# Patient Record
Sex: Male | Born: 1950 | Race: White | Hispanic: No | Marital: Married | Smoking: Never smoker
Health system: Southern US, Community
[De-identification: ages and names within clinical notes are randomized; demographics above are authoritative.]

## PROBLEM LIST (undated history)

## (undated) DIAGNOSIS — I10 Essential (primary) hypertension: Secondary | ICD-10-CM

## (undated) DIAGNOSIS — E039 Hypothyroidism, unspecified: Secondary | ICD-10-CM

## (undated) DIAGNOSIS — Z87442 Personal history of urinary calculi: Secondary | ICD-10-CM

## (undated) DIAGNOSIS — C61 Malignant neoplasm of prostate: Secondary | ICD-10-CM

## (undated) DIAGNOSIS — I341 Nonrheumatic mitral (valve) prolapse: Secondary | ICD-10-CM

## (undated) DIAGNOSIS — R011 Cardiac murmur, unspecified: Secondary | ICD-10-CM

## (undated) HISTORY — PX: LITHOTRIPSY: SUR834

## (undated) HISTORY — PX: PROSTATE BIOPSY: SHX241

## (undated) HISTORY — PX: OTHER SURGICAL HISTORY: SHX169

## (undated) HISTORY — PX: APPENDECTOMY: SHX54

---

## 2002-10-26 ENCOUNTER — Observation Stay (HOSPITAL_COMMUNITY): Admission: EM | Admit: 2002-10-26 | Discharge: 2002-10-27 | Payer: Self-pay | Admitting: *Deleted

## 2002-10-26 ENCOUNTER — Encounter: Payer: Self-pay | Admitting: Emergency Medicine

## 2002-10-26 ENCOUNTER — Emergency Department (HOSPITAL_COMMUNITY): Admission: EM | Admit: 2002-10-26 | Discharge: 2002-10-26 | Payer: Self-pay | Admitting: Emergency Medicine

## 2002-10-26 ENCOUNTER — Encounter: Payer: Self-pay | Admitting: Urology

## 2002-11-11 ENCOUNTER — Ambulatory Visit (HOSPITAL_BASED_OUTPATIENT_CLINIC_OR_DEPARTMENT_OTHER): Admission: RE | Admit: 2002-11-11 | Discharge: 2002-11-11 | Payer: Self-pay | Admitting: Urology

## 2002-11-11 ENCOUNTER — Encounter: Payer: Self-pay | Admitting: Urology

## 2005-10-12 ENCOUNTER — Emergency Department (HOSPITAL_COMMUNITY): Admission: EM | Admit: 2005-10-12 | Discharge: 2005-10-12 | Payer: Self-pay | Admitting: Family Medicine

## 2018-04-08 NOTE — Progress Notes (Signed)
GU Location of Tumor / Histology: Adenocarcinoma of the Prostate  If Prostate Cancer, Gleason Score is (3 + 4) and PSA is (4.34) and volume 62.2 grams.  William Conner presented about a month ago with signs/symptoms of: not fully emptying his bladder, starting and stopping several times while urinating, and having the push/strain to start urination.  Biopsies (if applicable) revealed: 67/03/4579   Past/Anticipated interventions by urology, if any: prostate biopsy, referral to Dr. Tammi Klippel to discuss radiation options, referral to Dr. Tresa Moore (appt on 05/01/2017) to discuss surgical options.   Past/Anticipated interventions by medical oncology, if any: no  Weight changes, if any: no  Bowel/Bladder complaints, if any: SHIM 18. IPSS 23. Reports frequent urination and getting up several times at night to urinate. Reports urgency. Denies dysuria, hematuria, urinary leakage or incontinence.    Nausea/Vomiting, if any: no  Pain issues, if any:  no  SAFETY ISSUES:  Prior radiation? no  Pacemaker/ICD? no  Possible current pregnancy? N/A  Is the patient on methotrexate? no  Current Complaints / other details:  67 years old. 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 Never smoked and does not have a family history of prostate cancer.

## 2018-04-13 ENCOUNTER — Encounter: Payer: Self-pay | Admitting: Medical Oncology

## 2018-04-13 ENCOUNTER — Other Ambulatory Visit: Payer: Self-pay

## 2018-04-13 ENCOUNTER — Ambulatory Visit
Admission: RE | Admit: 2018-04-13 | Discharge: 2018-04-13 | Disposition: A | Discharge status: Home / Self Care | Payer: Medicare Other | Source: Ambulatory Visit | Attending: Radiation Oncology | Admitting: Radiation Oncology

## 2018-04-13 ENCOUNTER — Encounter: Payer: Self-pay | Admitting: Radiation Oncology

## 2018-04-13 VITALS — BP 138/95 | HR 50 | Temp 98.1°F | Resp 20 | Wt 172.0 lb

## 2018-04-13 DIAGNOSIS — C61 Malignant neoplasm of prostate: Secondary | ICD-10-CM | POA: Insufficient documentation

## 2018-04-13 DIAGNOSIS — Z79899 Other long term (current) drug therapy: Secondary | ICD-10-CM | POA: Diagnosis not present

## 2018-04-13 HISTORY — DX: Malignant neoplasm of prostate: C61

## 2018-04-13 NOTE — Progress Notes (Signed)
2 Radiation Oncology         [phone] ________________________________  Initial Outpatient Consultation  Name: William Conner MRN: [id number]  Date: 04/13/2018  DOB: 09/30/1950  WE:[id number], Pcp Not In  Alyson Ingles Candee Furbish, MD   REFERRING PHYSICIAN: Cleon Gustin, MD  DIAGNOSIS: 68 y.o. gentleman with favorable,  intermediate risk, Stage T1c adenocarcinoma of the prostate with Gleason Score of 3+4, and PSA of 4.34    ICD-10-CM   1. Malignant neoplasm of prostate (William Conner) C61     HISTORY OF PRESENT ILLNESS: William Conner is a 67 y.o. male with a diagnosis of prostate cancer. He was noted to have an elevated PSA of 4.34 by his primary care physician, Dr. Mellody Drown.  Accordingly, he was referred for evaluation in urology by Dr. Alyson Ingles on 01/26/18,  digital rectal examination was performed at that time revealing symmetrical firmness with no nodules.  The patient proceeded to transrectal ultrasound with 12 biopsies of the prostate on 03/09/18.  The prostate volume measured 62.2 cc.  Out of 12 core biopsies, 4 were positive.  The maximum Gleason score was 3+4, and this was seen in left mid lateral and left apex lateral. Gleason 3+3 was seen in left apex and right mid lateral. He is scheduled to meet with Dr. Tresa Moore on 05/01/18.  The patient reviewed the biopsy results with his urologist and he has kindly been referred today for discussion of potential radiation treatment options.   PREVIOUS RADIATION THERAPY: No  PAST MEDICAL HISTORY:  Past Medical History:  Diagnosis Date  . Prostate cancer ([place])       PAST SURGICAL HISTORY: Past Surgical History:  Procedure Laterality Date  . APPENDECTOMY     at the age of 61  . LITHOTRIPSY    . OTHER SURGICAL HISTORY     exploratory heart surgery at the age of 63 (62)  . PROSTATE BIOPSY      FAMILY HISTORY:  Family History  Problem Relation Age of Onset  . Cancer Brother        brain tumor    SOCIAL HISTORY:  Social History    Socioeconomic History  . Marital status: Married    Spouse name: Not on file  . Number of children: Not on file  . Years of education: Not on file  . Highest education level: Not on file  Occupational History  . Not on file  Social Needs  . Financial resource strain: Not on file  . Food insecurity:    Worry: Not on file    Inability: Not on file  . Transportation needs:    Medical: Not on file    Non-medical: Not on file  Tobacco Use  . Smoking status: Never Smoker  . Smokeless tobacco: Never Used  Substance and Sexual Activity  . Alcohol use: Never    Frequency: Never  . Drug use: Never  . Sexual activity: Yes  Lifestyle  . Physical activity:    Days per week: Not on file    Minutes per session: Not on file  . Stress: Not on file  Relationships  . Social connections:    Talks on phone: Not on file    Gets together: Not on file    Attends religious service: Not on file    Active member of club or organization: Not on file    Attends meetings of clubs or organizations: Not on file    Relationship status: Not on file  . Intimate partner violence:  Fear of current or ex partner: Not on file    Emotionally abused: Not on file    Physically abused: Not on file    Forced sexual activity: Not on file  Other Topics Concern  . Not on file  Social History Narrative  . Not on file  The patient is married and lives in [place]. He owns a Publishing copy business.   ALLERGIES: Patient has no known allergies.  MEDICATIONS:  Current Outpatient Medications  Medication Sig Dispense Refill  . amLODipine-olmesartan (AZOR) 5-20 MG tablet Take 1 tablet by mouth daily.    . Ascorbic Acid (VITAMIN C) 1000 MG tablet Take 1,000 mg by mouth daily.    Marland Kitchen aspirin EC 81 MG tablet Take 81 mg by mouth daily.    . Cholecalciferol (VITAMIN D3) 250 MCG (10000 UT) capsule Take 10,000 Units by mouth daily.    Marland Kitchen levothyroxine (SYNTHROID, LEVOTHROID) 100 MCG tablet Take 100 mcg by mouth daily  before breakfast.    . Multiple Vitamins-Minerals (MULTIVITAMIN ADULT PO) Take by mouth.    . pravastatin (PRAVACHOL) 10 MG tablet Take 10 mg by mouth daily.    . vitamin B-12 (CYANOCOBALAMIN) 1000 MCG tablet Take 1,000 mcg by mouth daily.     No current facility-administered medications for this encounter.     REVIEW OF SYSTEMS:  On review of systems, the patient reports that he is doing well overall. He denies any chest pain, shortness of breath, cough, fevers, chills, night sweats, unintended weight changes. He denies any bowel disturbances, and denies abdominal pain, nausea or vomiting. He denies any new musculoskeletal or joint aches or pains. His IPSS was 23, indicating severe urinary symptoms. He reports frequent urination, getting up several times a night to urinate, and urgency. He denies dysuria, hematuria, and urinary leakage or incontinence. He is able to complete sexual activity with some attempts. A complete review of systems is obtained and is otherwise negative.    PHYSICAL EXAM:  Wt Readings from Last 3 Encounters:  04/13/18 172 lb (78 kg)   Temp Readings from Last 3 Encounters:  04/13/18 98.1 F (36.7 C) (Oral)   BP Readings from Last 3 Encounters:  04/13/18 (!) 138/95   Pulse Readings from Last 3 Encounters:  04/13/18 (!) 50   Pain Assessment Pain Score: 0-No pain/10  In general this is a well appearing Caucasian gentleman in no acute distress. He is alert and oriented x4 and appropriate throughout the examination. HEENT reveals that the patient is normocephalic, atraumatic. Cardiopulmonary assessment is negative for acute distress and he exhibits normal effort.   KPS = 100  100 - Normal; no complaints; no evidence of disease. 90   - Able to carry on normal activity; minor signs or symptoms of disease. 80   - Normal activity with effort; some signs or symptoms of disease. 31   - Cares for self; unable to carry on normal activity or to do active work. 60   -  Requires occasional assistance, but is able to care for most of his personal needs. 50   - Requires considerable assistance and frequent medical care. 69   - Disabled; requires special care and assistance. 69   - Severely disabled; hospital admission is indicated although death not imminent. 59   - Very sick; hospital admission necessary; active supportive treatment necessary. 10   - Moribund; fatal processes progressing rapidly. 0     - Dead  Karnofsky DA, Abelmann WH, Craver LS and Burchenal [facility] [phone])  The use of the nitrogen mustards in the palliative treatment of carcinoma: with particular reference to bronchogenic carcinoma Cancer 1 634-56  LABORATORY DATA:  No results found for: WBC, HGB, HCT, MCV, PLT No results found for: NA, K, CL, CO2 No results found for: ALT, AST, GGT, ALKPHOS, BILITOT   RADIOGRAPHY: No results found.    IMPRESSION/PLAN: 1. 67 y.o. gentleman with favorable,  intermediate risk, Stage T1c adenocarcinoma of the prostate with Gleason Score of 3+4, and PSA of 4.34. We discussed the patient's workup and outlines the nature of prostate cancer in this setting. The patient's T stage, Gleason's score, and PSA put him into the favorable intermediate risk group. Accordingly, he is eligible for a variety of potential treatment options including 5 weeks of external radiation versus prostatectomy. Given his gland size and urinary symptoms he is not a great candidate for brachytherapy.  We discussed the available radiation techniques, and focused on the details and logistics and delivery. We discussed and outlined the risks, benefits, short and long-term effects associated with radiotherapy and compared and contrasted these with prostatectomy. We discussed the role of SpaceOAR in reducing the rectal toxicity associated with radiotherapy.   At the end of our conversation, the patient would like to meet with Dr. Tresa Moore before making a final decision. I will follow up with him after his  appointment in January.   In a visit lasting 60 minutes, greater than 50% of the time was spent face to face discussing his case, and coordinating the patient's care.     Carola Rhine, [facility]   Page Me  Seen with  _____________________________________  Sheral Apley Tammi Klippel, M.D.   This document serves as a record of services personally performed by Shona Simpson, PA-C and Dr. Tammi Klippel. It was created on their behalf by Wilburn Mylar, a trained medical scribe. The creation of this record is based on the scribe's personal observations and the provider's statements to them. This document has been checked and approved by the attending provider.

## 2018-04-13 NOTE — Progress Notes (Signed)
Introduced myself to patient and his wife as the prostate nurse navigator and my role.  He states he was interested in surgery but after hearing his radiation options today, he is now leaning towards external beam. He will meet with Dr. Tresa Moore 1/3 to discuss surgery in detail. I encouraged him to learn about all of his options before making his final decision. I gave them my business card and asked them to call me with questions or concerns.

## 2018-04-13 NOTE — Progress Notes (Signed)
See progress note under physician encounter. 

## 2018-05-06 ENCOUNTER — Telehealth: Payer: Self-pay | Admitting: Radiation Oncology

## 2018-05-06 NOTE — Telephone Encounter (Signed)
I called to check on the patient's decision making process for treatment of his prostate cancer. He's decided to move forward with robotic prostatectomy. I told him we wish him all the best and that we're always here if he has questions or should he have a need to discuss radiation ever in the future.

## 2018-05-07 ENCOUNTER — Other Ambulatory Visit: Payer: Self-pay | Admitting: Urology

## 2018-05-18 ENCOUNTER — Ambulatory Visit
Admission: RE | Admit: 2018-05-18 | Discharge: 2018-05-18 | Disposition: A | Discharge status: Home / Self Care | Payer: Medicare Other | Source: Ambulatory Visit | Attending: Internal Medicine | Admitting: Internal Medicine

## 2018-05-18 ENCOUNTER — Other Ambulatory Visit: Payer: Self-pay | Admitting: Internal Medicine

## 2018-05-18 DIAGNOSIS — M25461 Effusion, right knee: Secondary | ICD-10-CM

## 2018-05-18 DIAGNOSIS — M25561 Pain in right knee: Principal | ICD-10-CM

## 2018-06-22 NOTE — Patient Instructions (Signed)
William Conner  06/22/2018   Your procedure is scheduled [id number] 06/26/2018   Report to [facility] Main  Entrance              Report to Short Stay  at  0530  AM    Call this number if you have problems the morning of surgery [phone]               Follow the BOWEL PREP instructions from Dr. Tresa Moore and a clear liquid diet all day up until midnight!              Please drink one bottle Magnesium Citrate by noon the day before surgery on Thursday 06/25/2018 and a clear liquid diet.     CLEAR LIQUID DIET   Foods Allowed                                                                     Foods Excluded  Coffee and tea, regular and decaf                             liquids that you cannot  Plain Jell-O in any flavor                                             see through such as: Fruit ices (not with fruit pulp)                                     milk, soups, orange juice  Iced Popsicles                                    All solid food Carbonated beverages, regular and diet                                    Cranberry, grape and apple juices Sports drinks like Gatorade Lightly seasoned clear broth or consume(fat free) Sugar, honey syrup  Sample Menu Breakfast                                Lunch                                     Supper Cranberry juice                    Beef broth                            Chicken broth Jell-O  Grape juice                           Apple juice Coffee or tea                        Jell-O                                      Popsicle                                                Coffee or tea                        Coffee or tea  _____________________________________________________________________    Remember: Do not eat food or drink liquids :After Midnight.              BRUSH YOUR TEETH MORNING OF SURGERY AND RINSE YOUR MOUTH OUT, NO CHEWING GUM CANDY OR  MINTS.     Take these medicines the morning of surgery with A SIP OF WATER: Levothyroxine (Synthroid)                                  You may not have any metal on your body including hair pins and              piercings  Do not wear jewelry, make-up, lotions, powders or perfumes, deodorant                         Men may shave face and neck.   Do not bring valuables to the hospital. [place].  Contacts, dentures or bridgework may not be worn into surgery.  Leave suitcase in the car. After surgery it may be brought to your room.                Please read over the following fact sheets you were given: _____________________________________________________________________             [facility] - Preparing for Surgery Before surgery, you can play an important role.  Because skin is not sterile, your skin needs to be as free of germs as possible.  You can reduce the number of germs on your skin by washing with CHG (chlorahexidine gluconate) soap before surgery.  CHG is an antiseptic cleaner which kills germs and bonds with the skin to continue killing germs even after washing. Please DO NOT use if you have an allergy to CHG or antibacterial soaps.  If your skin becomes reddened/irritated stop using the CHG and inform your nurse when you arrive at Short Stay. Do not shave (including legs and underarms) for at least 48 hours prior to the first CHG shower.  You may shave your face/neck. Please follow these instructions carefully:  1.  Shower with CHG Soap the night before surgery and the  morning of Surgery.  2.  If you choose to wash your hair, wash your hair first as usual with your  normal  shampoo.  3.  After you shampoo, rinse your hair and body thoroughly to remove the  shampoo.                           4.  Use CHG as you would any other liquid soap.  You can apply chg directly  to the skin and wash                       Gently  with a scrungie or clean washcloth.  5.  Apply the CHG Soap to your body ONLY FROM THE NECK DOWN.   Do not use on face/ open                           Wound or open sores. Avoid contact with eyes, ears mouth and genitals (private parts).                       Wash face,  Genitals (private parts) with your normal soap.             6.  Wash thoroughly, paying special attention to the area where your surgery  will be performed.  7.  Thoroughly rinse your body with warm water from the neck down.  8.  DO NOT shower/wash with your normal soap after using and rinsing off  the CHG Soap.                9.  Pat yourself dry with a clean towel.            10.  Wear clean pajamas.            11.  Place clean sheets on your bed the night of your first shower and do not  sleep with pets. Day of Surgery : Do not apply any lotions/deodorants the morning of surgery.  Please wear clean clothes to the hospital/surgery center.  FAILURE TO FOLLOW THESE INSTRUCTIONS MAY RESULT IN THE CANCELLATION OF YOUR SURGERY PATIENT SIGNATURE_________________________________  NURSE SIGNATURE__________________________________  ________________________________________________________________________

## 2018-06-23 ENCOUNTER — Other Ambulatory Visit: Payer: Self-pay

## 2018-06-23 ENCOUNTER — Encounter (HOSPITAL_COMMUNITY): Payer: Self-pay

## 2018-06-23 ENCOUNTER — Encounter (HOSPITAL_COMMUNITY)
Admission: RE | Admit: 2018-06-23 | Discharge: 2018-06-23 | Disposition: A | Discharge status: Home / Self Care | Payer: Medicare Other | Source: Ambulatory Visit

## 2018-06-23 DIAGNOSIS — Z7982 Long term (current) use of aspirin: Secondary | ICD-10-CM | POA: Diagnosis not present

## 2018-06-23 DIAGNOSIS — Z01818 Encounter for other preprocedural examination: Secondary | ICD-10-CM | POA: Insufficient documentation

## 2018-06-23 DIAGNOSIS — R001 Bradycardia, unspecified: Secondary | ICD-10-CM

## 2018-06-23 DIAGNOSIS — Z79899 Other long term (current) drug therapy: Secondary | ICD-10-CM | POA: Diagnosis not present

## 2018-06-23 DIAGNOSIS — C61 Malignant neoplasm of prostate: Secondary | ICD-10-CM | POA: Insufficient documentation

## 2018-06-23 DIAGNOSIS — I10 Essential (primary) hypertension: Secondary | ICD-10-CM | POA: Insufficient documentation

## 2018-06-23 DIAGNOSIS — E039 Hypothyroidism, unspecified: Secondary | ICD-10-CM | POA: Diagnosis not present

## 2018-06-23 DIAGNOSIS — Z79890 Hormone replacement therapy (postmenopausal): Secondary | ICD-10-CM | POA: Diagnosis not present

## 2018-06-23 DIAGNOSIS — E785 Hyperlipidemia, unspecified: Secondary | ICD-10-CM | POA: Diagnosis not present

## 2018-06-23 HISTORY — DX: Nonrheumatic mitral (valve) prolapse: I34.1

## 2018-06-23 HISTORY — DX: Essential (primary) hypertension: I10

## 2018-06-23 LAB — CBC
HCT: 47.1 % (ref 39.0–52.0)
Hemoglobin: 15.8 g/dL (ref 13.0–17.0)
MCH: 30.1 pg (ref 26.0–34.0)
MCHC: 33.5 g/dL (ref 30.0–36.0)
MCV: 89.7 fL (ref 80.0–100.0)
NRBC: 0 % (ref 0.0–0.2)
Platelets: 235 10*3/uL (ref 150–400)
RBC: 5.25 MIL/uL (ref 4.22–5.81)
RDW: 13.3 % (ref 11.5–15.5)
WBC: 7.7 10*3/uL (ref 4.0–10.5)

## 2018-06-23 LAB — BASIC METABOLIC PANEL
Anion gap: 6 (ref 5–15)
BUN: 16 mg/dL (ref 8–23)
CALCIUM: 9.3 mg/dL (ref 8.9–10.3)
CO2: 27 mmol/L (ref 22–32)
Chloride: 108 mmol/L (ref 98–111)
Creatinine, Ser: 0.88 mg/dL (ref 0.61–1.24)
GFR calc Af Amer: >60 mL/min (ref >60)
GFR calc non Af Amer: >60 mL/min (ref >60)
Glucose, Bld: 99 mg/dL (ref 70–99)
Potassium: 4.2 mmol/L (ref 3.5–5.1)
SODIUM: 141 mmol/L (ref 135–145)

## 2018-06-25 ENCOUNTER — Encounter (HOSPITAL_COMMUNITY): Payer: Self-pay | Admitting: Anesthesiology

## 2018-06-25 NOTE — Anesthesia Preprocedure Evaluation (Addendum)
Anesthesia Evaluation  Patient identified by MRN, date of birth, ID band Patient awake    Reviewed: Allergy & Precautions, NPO status , Patient's Chart, lab work & pertinent test results  Airway Mallampati: I  TM Distance: >3 FB Neck ROM: Full    Dental  (+) Upper Dentures, Lower Dentures   Pulmonary neg pulmonary ROS,    breath sounds clear to auscultation       Cardiovascular hypertension, Pt. on medications + Valvular Problems/Murmurs MVP  Rhythm:Regular Rate:Normal     Neuro/Psych negative neurological ROS  negative psych ROS   GI/Hepatic negative GI ROS, Neg liver ROS,   Endo/Other  Hypothyroidism   Renal/GU negative Renal ROS     Musculoskeletal negative musculoskeletal ROS (+)   Abdominal Normal abdominal exam  (+)   Peds  Hematology negative hematology ROS (+)   Anesthesia Other Findings   Reproductive/Obstetrics                            Anesthesia Physical Anesthesia Plan  ASA: II  Anesthesia Plan: General   Post-op Pain Management:    Induction: Intravenous  PONV Risk Score and Plan: 3 and Ondansetron, Dexamethasone and Midazolam  Airway Management Planned: Oral ETT  Additional Equipment: None  Intra-op Plan:   Post-operative Plan: Extubation in OR  Informed Consent: I have reviewed the patients History and Physical, chart, labs and discussed the procedure including the risks, benefits and alternatives for the proposed anesthesia with the patient or authorized representative who has indicated his/her understanding and acceptance.     Dental advisory given  Plan Discussed with: CRNA  Anesthesia Plan Comments:        Anesthesia Quick Evaluation

## 2018-06-26 ENCOUNTER — Encounter (HOSPITAL_COMMUNITY)
Admission: RE | Disposition: A | Discharge status: Home / Self Care | Payer: Self-pay | Source: Home / Self Care | Attending: Urology

## 2018-06-26 ENCOUNTER — Ambulatory Visit (HOSPITAL_COMMUNITY): Payer: Medicare Other | Admitting: Certified Registered"

## 2018-06-26 ENCOUNTER — Observation Stay (HOSPITAL_COMMUNITY)
Admission: RE | Admit: 2018-06-26 | Discharge: 2018-06-28 | Disposition: A | Discharge status: Home / Self Care | Payer: Medicare Other | Attending: Urology | Admitting: Urology

## 2018-06-26 ENCOUNTER — Encounter (HOSPITAL_COMMUNITY): Payer: Self-pay

## 2018-06-26 ENCOUNTER — Other Ambulatory Visit: Payer: Self-pay

## 2018-06-26 ENCOUNTER — Ambulatory Visit (HOSPITAL_COMMUNITY): Payer: Medicare Other | Admitting: Physician Assistant

## 2018-06-26 DIAGNOSIS — Z79899 Other long term (current) drug therapy: Secondary | ICD-10-CM | POA: Insufficient documentation

## 2018-06-26 DIAGNOSIS — Z79890 Hormone replacement therapy (postmenopausal): Secondary | ICD-10-CM | POA: Insufficient documentation

## 2018-06-26 DIAGNOSIS — I10 Essential (primary) hypertension: Secondary | ICD-10-CM | POA: Diagnosis not present

## 2018-06-26 DIAGNOSIS — E039 Hypothyroidism, unspecified: Secondary | ICD-10-CM | POA: Insufficient documentation

## 2018-06-26 DIAGNOSIS — Z7982 Long term (current) use of aspirin: Secondary | ICD-10-CM | POA: Diagnosis not present

## 2018-06-26 DIAGNOSIS — E785 Hyperlipidemia, unspecified: Secondary | ICD-10-CM | POA: Diagnosis not present

## 2018-06-26 DIAGNOSIS — C61 Malignant neoplasm of prostate: Principal | ICD-10-CM | POA: Insufficient documentation

## 2018-06-26 HISTORY — PX: LYMPHADENECTOMY: SHX5960

## 2018-06-26 HISTORY — PX: ROBOT ASSISTED LAPAROSCOPIC RADICAL PROSTATECTOMY: SHX5141

## 2018-06-26 LAB — HEMOGLOBIN AND HEMATOCRIT, BLOOD
HCT: 42.4 % (ref 39.0–52.0)
Hemoglobin: 14.6 g/dL (ref 13.0–17.0)

## 2018-06-26 SURGERY — PROSTATECTOMY, RADICAL, ROBOT-ASSISTED, LAPAROSCOPIC
Anesthesia: General

## 2018-06-26 MED ORDER — OXYCODONE HCL 5 MG PO TABS
5.0000 mg | ORAL_TABLET | ORAL | Status: DC | PRN
  Administered 2018-06-26 – 2018-06-28 (×6): 5 mg via ORAL
  Filled 2018-06-26 (×6): qty 1

## 2018-06-26 MED ORDER — SODIUM CHLORIDE 0.9 % IV BOLUS
1000.0000 mL | Freq: Once | INTRAVENOUS | Status: AC
  Administered 2018-06-26: 1000 mL via INTRAVENOUS

## 2018-06-26 MED ORDER — FENTANYL CITRATE (PF) 250 MCG/5ML IJ SOLN
INTRAMUSCULAR | Status: DC | PRN
  Administered 2018-06-26: 150 ug via INTRAVENOUS

## 2018-06-26 MED ORDER — ONDANSETRON HCL 4 MG/2ML IJ SOLN
INTRAMUSCULAR | Status: DC | PRN
  Administered 2018-06-26: 4 mg via INTRAVENOUS

## 2018-06-26 MED ORDER — ONDANSETRON HCL 4 MG/2ML IJ SOLN
INTRAMUSCULAR | Status: AC
  Filled 2018-06-26: qty 2

## 2018-06-26 MED ORDER — MIDAZOLAM HCL 2 MG/2ML IJ SOLN
INTRAMUSCULAR | Status: AC
  Filled 2018-06-26: qty 2

## 2018-06-26 MED ORDER — SUGAMMADEX SODIUM 200 MG/2ML IV SOLN
INTRAVENOUS | Status: DC | PRN
  Administered 2018-06-26: 200 mg via INTRAVENOUS

## 2018-06-26 MED ORDER — DEXTROSE-NACL 5-0.45 % IV SOLN
INTRAVENOUS | Status: DC
  Administered 2018-06-26 – 2018-06-27 (×2): via INTRAVENOUS

## 2018-06-26 MED ORDER — BUPIVACAINE LIPOSOME 1.3 % IJ SUSP
20.0000 mL | Freq: Once | INTRAMUSCULAR | Status: AC
  Administered 2018-06-26: 20 mL
  Filled 2018-06-26: qty 20

## 2018-06-26 MED ORDER — ACETAMINOPHEN 500 MG PO TABS
1000.0000 mg | ORAL_TABLET | Freq: Four times a day (QID) | ORAL | Status: AC
  Administered 2018-06-26 – 2018-06-27 (×4): 1000 mg via ORAL
  Filled 2018-06-26 (×4): qty 2

## 2018-06-26 MED ORDER — AMLODIPINE BESYLATE 5 MG PO TABS
5.0000 mg | ORAL_TABLET | Freq: Every day | ORAL | Status: DC
  Administered 2018-06-27: 5 mg via ORAL
  Filled 2018-06-26 (×2): qty 1

## 2018-06-26 MED ORDER — MIDAZOLAM HCL 2 MG/2ML IJ SOLN
INTRAMUSCULAR | Status: DC | PRN
  Administered 2018-06-26: 2 mg via INTRAVENOUS

## 2018-06-26 MED ORDER — ACETAMINOPHEN 10 MG/ML IV SOLN: 1000.0000 mg | Freq: Once | INTRAVENOUS | Status: DC | PRN

## 2018-06-26 MED ORDER — HYDROMORPHONE HCL 1 MG/ML IJ SOLN
0.5000 mg | INTRAMUSCULAR | Status: DC | PRN
  Administered 2018-06-26 – 2018-06-27 (×2): 1 mg via INTRAVENOUS
  Filled 2018-06-26 (×2): qty 1

## 2018-06-26 MED ORDER — DEXAMETHASONE SODIUM PHOSPHATE 10 MG/ML IJ SOLN
INTRAMUSCULAR | Status: DC | PRN
  Administered 2018-06-26: 8 mg via INTRAVENOUS

## 2018-06-26 MED ORDER — HYDROMORPHONE HCL 1 MG/ML IJ SOLN
INTRAMUSCULAR | Status: AC
  Filled 2018-06-26: qty 1

## 2018-06-26 MED ORDER — BELLADONNA ALKALOIDS-OPIUM 16.2-60 MG RE SUPP: 1.0000 | Freq: Four times a day (QID) | RECTAL | Status: DC | PRN

## 2018-06-26 MED ORDER — KETAMINE HCL 10 MG/ML IJ SOLN
INTRAMUSCULAR | Status: AC
  Filled 2018-06-26: qty 1

## 2018-06-26 MED ORDER — DIPHENHYDRAMINE HCL 50 MG/ML IJ SOLN: 12.5000 mg | Freq: Four times a day (QID) | INTRAMUSCULAR | Status: DC | PRN

## 2018-06-26 MED ORDER — DIPHENHYDRAMINE HCL 12.5 MG/5ML PO ELIX: 12.5000 mg | ORAL_SOLUTION | Freq: Four times a day (QID) | ORAL | Status: DC | PRN

## 2018-06-26 MED ORDER — SODIUM CHLORIDE (PF) 0.9 % IJ SOLN
INTRAMUSCULAR | Status: AC
  Filled 2018-06-26: qty 20

## 2018-06-26 MED ORDER — KETAMINE HCL 10 MG/ML IJ SOLN
INTRAMUSCULAR | Status: DC | PRN
  Administered 2018-06-26 (×2): 20 mg via INTRAVENOUS

## 2018-06-26 MED ORDER — ONDANSETRON HCL 4 MG/2ML IJ SOLN
4.0000 mg | INTRAMUSCULAR | Status: DC | PRN
  Administered 2018-06-26 – 2018-06-27 (×2): 4 mg via INTRAVENOUS
  Filled 2018-06-26 (×2): qty 2

## 2018-06-26 MED ORDER — ACETAMINOPHEN 160 MG/5ML PO SOLN: 325.0000 mg | Freq: Once | ORAL | Status: DC

## 2018-06-26 MED ORDER — STERILE WATER FOR IRRIGATION IR SOLN
Status: DC | PRN
  Administered 2018-06-26: 1000 mL

## 2018-06-26 MED ORDER — PROMETHAZINE HCL 25 MG/ML IJ SOLN: 6.2500 mg | INTRAMUSCULAR | Status: DC | PRN

## 2018-06-26 MED ORDER — SUGAMMADEX SODIUM 200 MG/2ML IV SOLN
INTRAVENOUS | Status: AC
  Filled 2018-06-26: qty 2

## 2018-06-26 MED ORDER — ATORVASTATIN CALCIUM 20 MG PO TABS
20.0000 mg | ORAL_TABLET | Freq: Every evening | ORAL | Status: DC
  Administered 2018-06-26 – 2018-06-27 (×2): 20 mg via ORAL
  Filled 2018-06-26 (×2): qty 1

## 2018-06-26 MED ORDER — MAGNESIUM CITRATE PO SOLN: 1.0000 | Freq: Once | ORAL | Status: DC

## 2018-06-26 MED ORDER — CEFAZOLIN SODIUM-DEXTROSE 2-4 GM/100ML-% IV SOLN
2.0000 g | INTRAVENOUS | Status: AC
  Administered 2018-06-26: 2 g via INTRAVENOUS
  Filled 2018-06-26: qty 100

## 2018-06-26 MED ORDER — ACETAMINOPHEN 325 MG PO TABS: 325.0000 mg | ORAL_TABLET | Freq: Once | ORAL | Status: DC

## 2018-06-26 MED ORDER — SULFAMETHOXAZOLE-TRIMETHOPRIM 800-160 MG PO TABS: 1.0000 | ORAL_TABLET | Freq: Two times a day (BID) | ORAL | 0 refills | Status: DC

## 2018-06-26 MED ORDER — AMLODIPINE-OLMESARTAN 5-40 MG PO TABS: 1.0000 | ORAL_TABLET | Freq: Every day | ORAL | Status: DC

## 2018-06-26 MED ORDER — PROPOFOL 10 MG/ML IV BOLUS
INTRAVENOUS | Status: DC | PRN
  Administered 2018-06-26: 130 mg via INTRAVENOUS

## 2018-06-26 MED ORDER — ROCURONIUM BROMIDE 10 MG/ML (PF) SYRINGE
PREFILLED_SYRINGE | INTRAVENOUS | Status: DC | PRN
  Administered 2018-06-26 (×2): 10 mg via INTRAVENOUS
  Administered 2018-06-26: 50 mg via INTRAVENOUS
  Administered 2018-06-26 (×2): 10 mg via INTRAVENOUS

## 2018-06-26 MED ORDER — LACTATED RINGERS IV SOLN: INTRAVENOUS | Status: DC

## 2018-06-26 MED ORDER — FENTANYL CITRATE (PF) 250 MCG/5ML IJ SOLN
INTRAMUSCULAR | Status: AC
  Filled 2018-06-26: qty 5

## 2018-06-26 MED ORDER — PROPOFOL 10 MG/ML IV BOLUS
INTRAVENOUS | Status: AC
  Filled 2018-06-26: qty 40

## 2018-06-26 MED ORDER — SODIUM CHLORIDE (PF) 0.9 % IJ SOLN
INTRAMUSCULAR | Status: DC | PRN
  Administered 2018-06-26: 20 mL

## 2018-06-26 MED ORDER — LEVOTHYROXINE SODIUM 75 MCG PO TABS
75.0000 ug | ORAL_TABLET | Freq: Every day | ORAL | Status: DC
  Administered 2018-06-27 – 2018-06-28 (×2): 75 ug via ORAL
  Filled 2018-06-26 (×2): qty 1

## 2018-06-26 MED ORDER — LACTATED RINGERS IR SOLN
Status: DC | PRN
  Administered 2018-06-26: 1000 mL

## 2018-06-26 MED ORDER — IRBESARTAN 300 MG PO TABS
300.0000 mg | ORAL_TABLET | Freq: Every day | ORAL | Status: DC
  Administered 2018-06-27: 300 mg via ORAL
  Filled 2018-06-26 (×2): qty 1

## 2018-06-26 MED ORDER — LIDOCAINE 2% (20 MG/ML) 5 ML SYRINGE
INTRAMUSCULAR | Status: AC
  Filled 2018-06-26: qty 5

## 2018-06-26 MED ORDER — LIDOCAINE 2% (20 MG/ML) 5 ML SYRINGE
INTRAMUSCULAR | Status: DC | PRN
  Administered 2018-06-26: 60 mg via INTRAVENOUS

## 2018-06-26 MED ORDER — AMLODIPINE BESYLATE 5 MG PO TABS
5.0000 mg | ORAL_TABLET | Freq: Once | ORAL | Status: AC
  Administered 2018-06-26: 5 mg via ORAL
  Filled 2018-06-26: qty 1

## 2018-06-26 MED ORDER — HYDROMORPHONE HCL 1 MG/ML IJ SOLN
0.2500 mg | INTRAMUSCULAR | Status: DC | PRN
  Administered 2018-06-26 (×2): 0.5 mg via INTRAVENOUS

## 2018-06-26 MED ORDER — LACTATED RINGERS IV SOLN
INTRAVENOUS | Status: DC
  Administered 2018-06-26: 07:00:00 via INTRAVENOUS

## 2018-06-26 MED ORDER — HYDROCODONE-ACETAMINOPHEN 5-325 MG PO TABS: 1.0000 | ORAL_TABLET | Freq: Four times a day (QID) | ORAL | 0 refills | Status: DC | PRN

## 2018-06-26 MED ORDER — EPHEDRINE SULFATE-NACL 50-0.9 MG/10ML-% IV SOSY
PREFILLED_SYRINGE | INTRAVENOUS | Status: DC | PRN
  Administered 2018-06-26 (×6): 5 mg via INTRAVENOUS

## 2018-06-26 MED ORDER — DEXAMETHASONE SODIUM PHOSPHATE 10 MG/ML IJ SOLN
INTRAMUSCULAR | Status: AC
  Filled 2018-06-26: qty 1

## 2018-06-26 MED ORDER — MEPERIDINE HCL 50 MG/ML IJ SOLN: 6.2500 mg | INTRAMUSCULAR | Status: DC | PRN

## 2018-06-26 MED ORDER — MENTHOL 3 MG MT LOZG
1.0000 | LOZENGE | OROMUCOSAL | Status: DC | PRN
  Administered 2018-06-26: 3 mg via ORAL
  Filled 2018-06-26 (×2): qty 9

## 2018-06-26 SURGICAL SUPPLY — 61 items
APPLICATOR COTTON TIP 6 STRL (MISCELLANEOUS) ×2 IMPLANT
APPLICATOR COTTON TIP 6IN STRL (MISCELLANEOUS) ×4
CATH FOLEY 2WAY SLVR 18FR 30CC (CATHETERS) ×4 IMPLANT
CATH TIEMANN FOLEY 18FR 5CC (CATHETERS) ×4 IMPLANT
CHLORAPREP W/TINT 26ML (MISCELLANEOUS) ×4 IMPLANT
CLIP VESOLOCK LG 6/CT PURPLE (CLIP) ×8 IMPLANT
CLOTH BEACON ORANGE TIMEOUT ST (SAFETY) ×4 IMPLANT
CONT SPEC 4OZ CLIKSEAL STRL BL (MISCELLANEOUS) ×4 IMPLANT
COVER TIP SHEARS 8 DVNC (MISCELLANEOUS) ×2 IMPLANT
COVER TIP SHEARS 8MM DA VINCI (MISCELLANEOUS) ×2
COVER WAND RF STERILE (DRAPES) IMPLANT
CUTTER ECHEON FLEX ENDO 45 340 (ENDOMECHANICALS) ×4 IMPLANT
DECANTER SPIKE VIAL GLASS SM (MISCELLANEOUS) ×4 IMPLANT
DERMABOND ADVANCED (GAUZE/BANDAGES/DRESSINGS) ×2
DERMABOND ADVANCED .7 DNX12 (GAUZE/BANDAGES/DRESSINGS) ×2 IMPLANT
DRAPE ARM DVNC X/XI (DISPOSABLE) ×8 IMPLANT
DRAPE COLUMN DVNC XI (DISPOSABLE) ×2 IMPLANT
DRAPE DA VINCI XI ARM (DISPOSABLE) ×8
DRAPE DA VINCI XI COLUMN (DISPOSABLE) ×2
DRAPE SURG IRRIG POUCH 19X23 (DRAPES) ×4 IMPLANT
DRSG TEGADERM 4X4.75 (GAUZE/BANDAGES/DRESSINGS) ×4 IMPLANT
ELECT PENCIL ROCKER SW 15FT (MISCELLANEOUS) ×4 IMPLANT
ELECT REM PT RETURN 15FT ADLT (MISCELLANEOUS) ×4 IMPLANT
GAUZE SPONGE 2X2 8PLY STRL LF (GAUZE/BANDAGES/DRESSINGS) IMPLANT
GLOVE BIO SURGEON STRL SZ 6.5 (GLOVE) ×3 IMPLANT
GLOVE BIO SURGEONS STRL SZ 6.5 (GLOVE) ×1
GLOVE BIOGEL M STRL SZ7.5 (GLOVE) ×8 IMPLANT
GLOVE BIOGEL PI IND STRL 7.5 (GLOVE) ×2 IMPLANT
GLOVE BIOGEL PI INDICATOR 7.5 (GLOVE) ×2
GOWN STRL REUS W/TWL LRG LVL3 (GOWN DISPOSABLE) ×12 IMPLANT
HOLDER FOLEY CATH W/STRAP (MISCELLANEOUS) ×4 IMPLANT
IRRIG SUCT STRYKERFLOW 2 WTIP (MISCELLANEOUS) ×4
IRRIGATION SUCT STRKRFLW 2 WTP (MISCELLANEOUS) ×2 IMPLANT
IV LACTATED RINGERS 1000ML (IV SOLUTION) ×4 IMPLANT
KIT PROCEDURE DA VINCI SI (MISCELLANEOUS) ×2
KIT PROCEDURE DVNC SI (MISCELLANEOUS) ×2 IMPLANT
NEEDLE INSUFFLATION 14GA 120MM (NEEDLE) ×4 IMPLANT
NEEDLE SPNL 22GX7 QUINCKE BK (NEEDLE) ×4 IMPLANT
PACK ROBOT UROLOGY CUSTOM (CUSTOM PROCEDURE TRAY) ×4 IMPLANT
PAD POSITIONING PINK XL (MISCELLANEOUS) ×4 IMPLANT
PORT ACCESS TROCAR AIRSEAL 12 (TROCAR) ×2 IMPLANT
PORT ACCESS TROCAR AIRSEAL 5M (TROCAR) ×2
SEAL CANN UNIV 5-8 DVNC XI (MISCELLANEOUS) ×8 IMPLANT
SEAL XI 5MM-8MM UNIVERSAL (MISCELLANEOUS) ×8
SET TRI-LUMEN FLTR TB AIRSEAL (TUBING) ×4 IMPLANT
SOLUTION ELECTROLUBE (MISCELLANEOUS) ×4 IMPLANT
SPONGE GAUZE 2X2 STER 10/PKG (GAUZE/BANDAGES/DRESSINGS)
SPONGE LAP 4X18 RFD (DISPOSABLE) ×4 IMPLANT
STAPLE RELOAD 45 GRN (STAPLE) ×2 IMPLANT
STAPLE RELOAD 45MM GREEN (STAPLE) ×2
SUT ETHILON 3 0 PS 1 (SUTURE) ×4 IMPLANT
SUT MNCRL AB 4-0 PS2 18 (SUTURE) ×8 IMPLANT
SUT PDS AB 1 CT1 27 (SUTURE) ×8 IMPLANT
SUT VIC AB 2-0 SH 27 (SUTURE) ×2
SUT VIC AB 2-0 SH 27X BRD (SUTURE) ×2 IMPLANT
SUT VICRYL 0 UR6 27IN ABS (SUTURE) ×4 IMPLANT
SUT VLOC BARB 180 ABS3/0GR12 (SUTURE) ×12
SUTURE VLOC BRB 180 ABS3/0GR12 (SUTURE) ×6 IMPLANT
SYR 27GX1/2 1ML LL SAFETY (SYRINGE) ×4 IMPLANT
TOWEL OR NON WOVEN STRL DISP B (DISPOSABLE) ×4 IMPLANT
WATER STERILE IRR 1000ML POUR (IV SOLUTION) ×4 IMPLANT

## 2018-06-26 NOTE — Brief Op Note (Signed)
06/26/2018  10:14 AM  PATIENT:  William Conner.  68 y.o. male  PRE-OPERATIVE DIAGNOSIS:  PROSTATE CANCER  POST-OPERATIVE DIAGNOSIS:  PROSTATE CANCER  PROCEDURE:  Procedure(s) with comments: XI ROBOTIC ASSISTED LAPAROSCOPIC RADICAL PROSTATECTOMY (N/A) - 3 HRS LYMPHADENECTOMY (Bilateral)  SURGEON:  Surgeon(s) and Role:    * Alexis Frock, MD - Primary  PHYSICIAN ASSISTANT:   ASSISTANTS: Clemetine Marker PA   ANESTHESIA:   local and general  EBL:  60 mL   BLOOD ADMINISTERED:none  DRAINS: 1 - JP to bulb; 2 - Foley to gravity    LOCAL MEDICATIONS USED:  MARCAINE     SPECIMEN:  Source of Specimen:  1 - prostatectomy; 2- pelvic lymph nodes; 3 - peri-prostatic fat  DISPOSITION OF SPECIMEN:  PATHOLOGY  COUNTS:  YES  TOURNIQUET:  * No tourniquets in log *  DICTATION: .Other Dictation: Dictation Number [id number]  PLAN OF CARE: Admit for overnight observation  PATIENT DISPOSITION:  PACU - hemodynamically stable.   Delay start of Pharmacological VTE agent (>24hrs) due to surgical blood loss or risk of bleeding: yes

## 2018-06-26 NOTE — H&P (Signed)
William Conner. is an 68 y.o. male.    Chief Complaint: Pre-OP Prostatectomy  HPI:   1 - Moderate Risk Prostate Cancer - 2 cores Gleason 3+4=7 (grade 2) up to 10% LLM, LLA and Grade 1 cancer LMA, RLM on eval rising PSA to 4.34. TRUS 23mL with small median lobe.   PMH sig for HLD, HTN, Low Thyroid. NO ischemic CV disease / blood thinners. 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 His PCP is William Conner.   Today "William Conner" is seen to proceed with prostatectomy.    Past Medical History:  Diagnosis Date  . Hypertension   . Mitral valve prolapse    age 80  . Prostate cancer [facility])     Past Surgical History:  Procedure Laterality Date  . APPENDECTOMY     at the age of 63  . LITHOTRIPSY    . OTHER SURGICAL HISTORY     exploratory heart surgery at the age of 36 (56)  . PROSTATE BIOPSY      Family History  Problem Relation Age of Onset  . Cancer Brother        brain tumor   Social History:  reports that he has never smoked. He has never used smokeless tobacco. He reports that he does not drink alcohol or use drugs.  Allergies: No Known Allergies  Medications Prior to Admission  Medication Sig Dispense Refill  . amLODipine-olmesartan (AZOR) 5-40 MG tablet Take 1 tablet by mouth daily.     . Ascorbic Acid (VITAMIN C) 1000 MG tablet Take 1,000 mg by mouth daily.    Marland Kitchen aspirin EC 81 MG tablet Take 81 mg by mouth daily.    Marland Kitchen atorvastatin (LIPITOR) 20 MG tablet Take 20 mg by mouth every evening.    . Cholecalciferol (VITAMIN D3) 25 MCG (1000 UT) CAPS Take 1,000 Units by mouth daily.     Marland Kitchen levothyroxine (SYNTHROID, LEVOTHROID) 75 MCG tablet Take 75 mcg by mouth at bedtime.     . Multiple Vitamins-Minerals (MULTIVITAMIN ADULT PO) Take 1 tablet by mouth daily.     . vitamin B-12 (CYANOCOBALAMIN) 1000 MCG tablet Take 1,000 mcg by mouth daily.      No results found for this or any previous visit (from the past 48 hour(s)). No results found.  Review of Systems   Constitutional: Negative.  Negative for fever.  HENT: Negative.   Eyes: Negative.   Respiratory: Negative.   Cardiovascular: Negative.   Gastrointestinal: Negative.   Genitourinary: Negative.   Musculoskeletal: Negative.   Skin: Negative.   Neurological: Negative.   Endo/Heme/Allergies: Negative.   Psychiatric/Behavioral: Negative.     Blood pressure (!) 141/117, pulse 61, temperature 97.6 F (36.4 C), temperature source Oral, resp. rate 16, height 5\' 10"  (1.778 m), weight 77.1 kg, SpO2 100 %. Physical Exam  Constitutional: He appears well-developed.  HENT:  Head: Normocephalic.  Eyes: Pupils are equal, round, and reactive to light.  Neck: Normal range of motion.  Cardiovascular: Normal rate.  Respiratory: Effort normal.  GI: Soft.  Genitourinary:    Genitourinary Comments: NO CVAT   Musculoskeletal: Normal range of motion.  Neurological: He is alert.  Skin: Skin is warm.     Assessment/Plan  Proceed as planned with prostatectomy / node dissection. Risks, benefits, alternatives, expected peri-op course discussed previously and reiterated today.   William Frock, Conner 06/26/2018, 6:59 AM

## 2018-06-26 NOTE — Transfer of Care (Signed)
Immediate Anesthesia Transfer of Care Note  Patient: William Conner.  Procedure(s) Performed: XI ROBOTIC ASSISTED LAPAROSCOPIC RADICAL PROSTATECTOMY (N/A ) LYMPHADENECTOMY (Bilateral )  Patient Location: PACU  Anesthesia Type:General  Level of Consciousness: awake, alert  and oriented  Airway & Oxygen Therapy: Patient Spontanous Breathing and Patient connected to face mask oxygen  Post-op Assessment: Report given to RN and Post -op Vital signs reviewed and stable  Post vital signs: Reviewed and stable  Last Vitals:  Vitals Value Taken Time  BP    Temp    Pulse 81 06/26/2018 10:30 AM  Resp 21 06/26/2018 10:30 AM  SpO2 99 % 06/26/2018 10:30 AM  Vitals shown include unvalidated device data.  Last Pain:  Vitals:   06/26/18 0628  TempSrc:   PainSc: 0-No pain         Complications: No apparent anesthesia complications

## 2018-06-26 NOTE — Op Note (Signed)
NAME: William Conner, William Conner MEDICAL RECORD TD:[id number] ACCOUNT [id number] DATE OF BIRTH:Dec 25, 1950 FACILITY: [facility] LOCATION: [facility] PHYSICIAN:Elverda Wendel Tresa Moore, MD  OPERATIVE REPORT  DATE OF PROCEDURE:  06/26/2018  PREOPERATIVE DIAGNOSIS:  Moderate risk prostate cancer.  PROCEDURE: 1.  Robotic-assisted laparoscopic radical prostatectomy. 2.  Bilateral pelvic lymphadenectomy. 3.  Injection of indocyanine green dye for sentinel lymph node angiography.  ESTIMATED BLOOD LOSS:  Less than 100 mL.  MEDICATIONS:  None.  SPECIMENS: 1.  Right external iliac lymph nodes. 2.  Right obturator lymph nodes. 3.  Right internal iliac lymph nodes. 4.  Left external iliac lymph nodes. 5.  Left obturator lymph nodes. 6.  Periprosthetic fat. 7.  Prostatectomy, all for permanent pathology.  ASSISTANT:  Debbrah Alar, PA  FINDINGS: 1.  Bilateral sentinel lymph nodes in the pelvis demarcated as such on pathology requisition. 2.  Very mild loose adhesions, right lower quadrant, consistent with prior appendectomy.  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  Options were discussed for management including surveillance  protocols versus ablative therapies versus surgical extirpation and wished to proceed with the latter.  Informed consent was obtained and placed in the medical record.  PROCEDURE IN DETAIL:  The patient being identified, procedure being radical prostatectomy was confirmed.  Procedure timeout was performed.  Intravenous antibiotics were administered.  General endotracheal anesthesia was induced.  The patient was placed  into a low lithotomy position.  A sterile field was created by prepping and draping his penis, perineum and proximal thighs using iodine on his infraxiphoid abdomen utilizing chlorhexidine gluconate after clipper shaving and  further fashioning the  operative table using 3-inch tape over foam padding across the supraxiphoid chest.  His arms were tucked to his side with gel rolls.  A test of steep Trendelenburg positioning was performed and found to be suitably positioned.  A Foley catheter was  placed free to straight drain.  Next, a high-flow, low-pressure pneumoperitoneum was obtained using Veress technique in the supraumbilical midline, having passed the aspiration and drop test.  An 8 mm robotic camera port was then placed in the same  location.  Laparoscopic examination of the peritoneal cavity revealed some loose adhesions between some periepiploic fat and the cecum and right lower quadrant consistent with prior appendectomy.  Additional ports were placed as follows:  Right  paramedian 8 mm robotic port, right far lateral 12 mm AirSeal assist port, right paramedian 5 mm suction port, left paramedian 8 mm robotic port, left far lateral 8 mm robotic port.  Robot was docked and passed the electronic checks.  Initial attention  was directed at development of the space of Retzius, and it was made lateral to the left median umbilical ligament from the midline towards the area of the internal ring, coursing along the iliac vessels towards the area of the left ureter, which was  positively identified.  The left vas deferens was encountered, ligated using a medial bucket handle.  The left bladder wall was swept away from the lateral aspect of the pelvic sidewall towards the area of the endopelvic fascia on the left side.  A  mirror image dissection was performed on the right side.  Anterior attachments were taken down using cautery scissors.  This exposed the anterior base of the prostate, which was defatted to better demarcate the bladder neck, prostate junction.  This was  set aside and  labeled as periprosthetic fat.  Next, 0.2 mL of indocyanine green dye was injected using a robotically guided percutaneously placed spinal needle  just superior to the pubic symphysis with intervening suctioning to prevent dye spillage.  The  endopelvic fascia was then swept away from the lateral aspect of the prostate in base to apex orientation.  This exposed the dorsal venous complex.  It was controlled using a green load stapler.  This resulted in excellent hemostatic control of the  structure.  Exquisite care was taken to avoid membranous urethral injury.  This did not occur.  It had been approximately 10 minutes post-dye injection, and the pelvis was once again inspected under near infrared fluorescence light.  Sentinel lymph angiography revealed multiple lymphatic pathways coursing towards the area of the pelvic lymph node fields on each side.  Attention was directed at lymphadenectomy, first on the right side where all fibrofatty tissue within the boundaries  of the right external iliac artery, vein, pelvic sidewall, iliac bifurcation were dissected free.  Hemostasis achieved with cold clips.  SI labeled right external iliac lymph nodes.  Fatty tissue with the boundaries of the right external iliac vein, pelvic sidewall, obturator nerve were dissected free.  Lymphostasis was achieved with cold clips.  There were sentinel nodes within the sac.  It was labeled as such.  The obturator nerve  was inspected following maneuvers and found to be uninjured.  There was additional sentinel lymphatic tissue seen in a location on the right side, somewhat perivesical but along the course of the right internal iliac artery.  Hemostasis was achieved with  cold clips, set aside and labeled as such.  Right lymphadenectomy was then performed on the left side, the left external iliac and left obturator group, respectively.  These were the sentinel lymph nodes within them.  The left obturator nerve was  inspected following maneuvers and found to be uninjured.  Attention was directed at bladder neck dissection.  The bladder neck was identified by moving the Foley  catheter back and forth, and a lateral release was performed on each side to better  demarcate the bladder neck prostate junction.  The bladder neck was carefully separated from the base of the prostate in anterior-posterior direction, keeping what appeared to be a rim of circular muscle fibers each plane of dissection.  As expected,  there was a small median lobe approximately 8-9 mm in height.  This was carefully dissected free with the prostatectomy specimen.  Posterior dissection was performed by incising approximately 7 mm inferior-posterior portion of the prostate and entering  the plane of Denonvilliers.  Bilateral vas deferens was encountered, dissected for a distance of approximately 4 cm, ligated and placed on gentle superior traction.  Bilateral seminal vesicles were dissected to their tips and placed on gentle superior  traction.  Dissection proceeded within this plane inferiorly towards the area of the apex of the prostate.  This exposed the vascular pedicles on each side.  The right pedicle was first controlled using a sequential clipping technique in a base-to-apex  orientation, and aggressive nerve sparing was performed on the right side.  Similarly, left pedicle was controlled using a sequential clipping technique in a base-to-apex orientation, and moderate nerve sparing was performed on the left side.  Final  apical dissection was performed in the anterior plane by placing the prostate on superior traction and transecting the membranous urethra coldly.  This completely freed the prostate.  The specimen was placed in an EndoCatch bag for later  retrieval.   Digital rectal exam was then performed using indicator glove under laparoscopic vision.  No evidence of rectal violation was noted.  Posterior reconstruction was performed using a 3-0 V-Loc suture, reapproximating the posterior urethral plate at the  posterior bladder neck, bringing the structures into tension-free apposition.   Mucosa-to-mucosa anastomosis was performed using double-armed 3-0 V-Loc suture from the 6 o'clock, 12 o'clock position which resulted in excellent mucosal apposition of the  membranous urethra and bladder neck.  A Foley catheter was then placed, which irrigated quantitatively.  All sponge and needle counts were correct.  Hemostasis appeared excellent.  A closed suction drain was brought out the previous left lateral-most  robotic port site near the peritoneal cavity.  The robot was then undocked.  The specimen was retrieved by extending the previous camera port site inferiorly for a distance of approximately 3 cm, removing the prostatectomy specimen and setting aside for  pathology.  The extraction site was closed with fascia using figure-of-eight PDS x4 followed by reapproximation of Scarpa's with a running Vicryl.  All incision sites were infiltrated with dilute lipolyzed Marcaine and closed the fascia using  subcuticular Monocryl and Dermabond.  The procedure was then terminated.  The patient tolerated the procedure well.  No immediate perioperative complications.  The patient was taken to postanesthesia care in stable condition.  Please note, first assistant Debbrah Alar was crucial for all portions of the surgery today.  She provided invaluable retraction, specimen manipulation, lymphatic clipping, vascular stapling, and general first assistance.  LN/NUANCE  D:06/26/2018 T:06/26/2018 JOB:[id number]

## 2018-06-26 NOTE — Anesthesia Postprocedure Evaluation (Signed)
Anesthesia Post Note  Patient: William Conner.  Procedure(s) Performed: XI ROBOTIC ASSISTED LAPAROSCOPIC RADICAL PROSTATECTOMY (N/A ) LYMPHADENECTOMY (Bilateral )     Patient location during evaluation: PACU Anesthesia Type: General Level of consciousness: awake and alert Pain management: pain level controlled Vital Signs Assessment: post-procedure vital signs reviewed and stable Respiratory status: spontaneous breathing, nonlabored ventilation, respiratory function stable and patient connected to nasal cannula oxygen Cardiovascular status: blood pressure returned to baseline and stable Postop Assessment: no apparent nausea or vomiting Anesthetic complications: no    Last Vitals:  Vitals:   06/26/18 1130 06/26/18 1153  BP: 114/72 116/69  Pulse: 78 81  Resp:  20  Temp: (!) 36.4 C (!) 36.4 C  SpO2: 96% 93%    Last Pain:  Vitals:   06/26/18 1216  TempSrc:   PainSc: 0-No pain                 Effie Berkshire

## 2018-06-26 NOTE — Anesthesia Procedure Notes (Signed)
Procedure Name: Intubation Date/Time: 06/26/2018 7:47 AM Performed by: Eben Burow, CRNA Pre-anesthesia Checklist: Patient identified, Emergency Drugs available, Suction available, Patient being monitored and Timeout performed Patient Re-evaluated:Patient Re-evaluated prior to induction Oxygen Delivery Method: Circle system utilized Preoxygenation: Pre-oxygenation with 100% oxygen Induction Type: IV induction Ventilation: Mask ventilation without difficulty Laryngoscope Size: Mac and 4 Grade View: Grade I Tube type: Oral Tube size: 7.5 mm Number of attempts: 1 Airway Equipment and Method: Stylet Placement Confirmation: ETT inserted through vocal cords under direct vision,  positive ETCO2 and breath sounds checked- equal and bilateral Secured at: 22 cm Tube secured with: Tape Dental Injury: Teeth and Oropharynx as per pre-operative assessment

## 2018-06-26 NOTE — Discharge Instructions (Signed)

## 2018-06-27 DIAGNOSIS — C61 Malignant neoplasm of prostate: Secondary | ICD-10-CM | POA: Diagnosis not present

## 2018-06-27 LAB — BASIC METABOLIC PANEL
Anion gap: 7 (ref 5–15)
BUN: 12 mg/dL (ref 8–23)
CO2: 23 mmol/L (ref 22–32)
Calcium: 8.4 mg/dL — ABNORMAL LOW (ref 8.9–10.3)
Chloride: 102 mmol/L (ref 98–111)
Creatinine, Ser: 0.86 mg/dL (ref 0.61–1.24)
GFR calc Af Amer: >60 mL/min (ref >60)
GFR calc non Af Amer: >60 mL/min (ref >60)
Glucose, Bld: 134 mg/dL — ABNORMAL HIGH (ref 70–99)
Potassium: 3.9 mmol/L (ref 3.5–5.1)
Sodium: 132 mmol/L — ABNORMAL LOW (ref 135–145)

## 2018-06-27 LAB — HEMOGLOBIN AND HEMATOCRIT, BLOOD
HCT: 39.1 % (ref 39.0–52.0)
Hemoglobin: 13.5 g/dL (ref 13.0–17.0)

## 2018-06-27 MED ORDER — SIMETHICONE 80 MG PO CHEW
80.0000 mg | CHEWABLE_TABLET | Freq: Four times a day (QID) | ORAL | Status: DC | PRN
  Administered 2018-06-27: 80 mg via ORAL
  Filled 2018-06-27: qty 1

## 2018-06-27 MED ORDER — CHLORPROMAZINE HCL 10 MG PO TABS
10.0000 mg | ORAL_TABLET | Freq: Three times a day (TID) | ORAL | Status: DC | PRN
  Administered 2018-06-27: 10 mg via ORAL
  Filled 2018-06-27 (×2): qty 1

## 2018-06-27 NOTE — Care Management Obs Status (Signed)
[facility] NOTIFICATION   Patient Details  Name: William Conner. MRN: [id number] Date of Birth: [birth year]   Medicare Observation Status Notification Given:  Yes    Erenest Rasher, RN 06/27/2018, 5:49 PM

## 2018-06-27 NOTE — Progress Notes (Signed)
1 Day Post-Op Subjective: No acute events overnight.  Around 6 AM this morning, the patient started experiencing hiccups with associated nausea and vomiting.  He has a good appetite, but states that when he tries to eat broth the hiccups worsen.  He complains of abdominal soreness, especially when hiccuping, but states that the pain is markedly improved compared to yesterday.  He is passing flatus, but has not had a bowel movement yet.    Objective: Vital signs in last 24 hours: Temp:  [97.5 F (36.4 C)-98.1 F (36.7 C)] 98.1 F (36.7 C) (02/29 0506) Pulse Rate:  [59-84] 59 (02/29 0506) Resp:  [14-21] 20 (02/29 0506) BP: (107-134)/(64-76) 123/69 (02/29 0506) SpO2:  [93 %-100 %] 95 % (02/29 0506) Weight:  [78.1 kg] 78.1 kg (02/28 1200)  Intake/Output from previous day: 02/28 0701 - 02/29 0700 In: 3594.2 [I.V.:2494.2; IV Piggyback:1100] Out: 1945 [Urine:1650; Drains:235; Blood:60]  Intake/Output this shift: Total I/O In: -  Out: 200 [Emesis/NG output:100; Drains:100]  Physical Exam:  General: Alert and oriented CV: RRR, palpable distal pulses Lungs: CTAB, equal chest rise Abdomen: Soft, NTND, no rebound or guarding, JP drain with serosanguineous output Incisions: Clean, dry and intact Gu: Foley catheter in place and draining clear-yellow urine Ext: NT, No erythema  Lab Results: Recent Labs    06/26/18 1041 06/27/18 0533  HGB 14.6 13.5  HCT 42.4 39.1   BMET Recent Labs    06/27/18 0533  NA 132*  K 3.9  CL 102  CO2 23  GLUCOSE 134*  BUN 12  CREATININE 0.86  CALCIUM 8.4*     Studies/Results: No results found.  Assessment/Plan: Postop day 1 status post robot-assisted laparoscopic prostatectomy with bilateral pelvic lymph node dissection  -DC JP drain -Simethicone and zofran prn hiccups/nausea  -Out of bed to chair and ambulate -Advance diet as tolerated -Should his hiccups subside and he is able to tolerate a regular diet, possible discharge home later  today with Foley catheter   LOS: 0 days   Ellison Hughs, MD [facility] Pager: [phone]  06/27/2018, 9:30 AM

## 2018-06-28 DIAGNOSIS — C61 Malignant neoplasm of prostate: Secondary | ICD-10-CM | POA: Diagnosis not present

## 2018-06-28 NOTE — Discharge Summary (Signed)
Date of admission: 06/26/2018  Date of discharge: 06/28/2018  Admission diagnosis: Prostate cancer  Discharge diagnosis: Same  Procedures: Robotic assisted laparoscopic prostatectomy with bilateral pelvic lymph node dissection on 06/26/2018 with Dr. Tresa Moore  History and Physical: For full details, please see admission history and physical. Briefly, William Frayre. is a 68 y.o. year old patient with Gleason 3+4 = 7 prostate cancer.   Hospital Course: Postoperatively, the patient was monitored on the floor and had a routine postoperative course.  He was discharged home on postop day 2 with his Foley catheter.  Physical Exam:  General: Alert and oriented CV: RRR, palpable distal pulses Lungs: CTAB, equal chest rise Abdomen: Soft, NTND, no rebound or guarding Incisions: Clean, dry and intact GU: Foley catheter in place and draining clear-yellow urine Ext: NT, No erythema  Laboratory values:  Recent Labs    06/26/18 1041 06/27/18 0533  HGB 14.6 13.5  HCT 42.4 39.1   Recent Labs    06/27/18 0533  CREATININE 0.86    Disposition: Home  Discharge instruction: The patient was instructed to be ambulatory but told to refrain from heavy lifting, strenuous activity, or driving.  Discharge medications:  Allergies as of 06/28/2018   No Known Allergies     Medication List    STOP taking these medications   aspirin EC 81 MG tablet   MULTIVITAMIN ADULT PO   vitamin B-12 1000 MCG tablet Commonly known as:  CYANOCOBALAMIN   vitamin C 1000 MG tablet   Vitamin D3 25 MCG (1000 UT) Caps     TAKE these medications   amLODipine-olmesartan 5-40 MG tablet Commonly known as:  AZOR Take 1 tablet by mouth daily.   atorvastatin 20 MG tablet Commonly known as:  LIPITOR Take 20 mg by mouth every evening.   HYDROcodone-acetaminophen 5-325 MG tablet Commonly known as:  NORCO Take 1-2 tablets by mouth every 6 (six) hours as needed.   levothyroxine 75 MCG tablet Commonly known as:   SYNTHROID, LEVOTHROID Take 75 mcg by mouth at bedtime.   sulfamethoxazole-trimethoprim 800-160 MG tablet Commonly known as:  BACTRIM DS,SEPTRA DS Take 1 tablet by mouth 2 (two) times daily. Start the day prior to foley removal appointment       Followup:  Follow-up Information    Alexis Frock, MD On 07/06/2018.   Specialty:  Urology Why:  at 9:30 AM for MD visit and catheter removal.  Contact information: [address] [place] [phone]

## 2018-06-28 NOTE — Progress Notes (Signed)
D/C instructions reviewed w/ pt and wife. Both verbalize understanding and all questions answered. Foley/leg bag care reviewed as well, both verbalize comfort with this. Pt wife in possession of d/c packet, leg bag supplies, and all personal belongings. Awaiting family for ride home.

## 2018-06-29 ENCOUNTER — Encounter (HOSPITAL_COMMUNITY): Payer: Self-pay | Admitting: Urology

## 2018-10-13 ENCOUNTER — Other Ambulatory Visit (HOSPITAL_COMMUNITY)
Admission: RE | Admit: 2018-10-13 | Discharge: 2018-10-13 | Disposition: A | Discharge status: Home / Self Care | Payer: Medicare HMO | Source: Ambulatory Visit | Attending: Urology | Admitting: Urology

## 2018-10-13 ENCOUNTER — Encounter (HOSPITAL_COMMUNITY): Payer: Self-pay | Admitting: General Practice

## 2018-10-13 ENCOUNTER — Other Ambulatory Visit: Payer: Self-pay | Admitting: Urology

## 2018-10-13 DIAGNOSIS — Z1159 Encounter for screening for other viral diseases: Secondary | ICD-10-CM | POA: Diagnosis present

## 2018-10-14 LAB — NOVEL CORONAVIRUS, NAA (HOSP ORDER, SEND-OUT TO REF LAB; TAT 18-24 HRS): SARS-CoV-2, NAA: NOT DETECTED

## 2018-10-15 ENCOUNTER — Ambulatory Visit (HOSPITAL_COMMUNITY): Payer: Medicare HMO

## 2018-10-15 ENCOUNTER — Ambulatory Visit (HOSPITAL_COMMUNITY)
Admission: RE | Admit: 2018-10-15 | Discharge: 2018-10-15 | Disposition: A | Discharge status: Home / Self Care | Payer: Medicare HMO | Attending: Urology | Admitting: Urology

## 2018-10-15 ENCOUNTER — Encounter (HOSPITAL_COMMUNITY): Payer: Self-pay | Admitting: *Deleted

## 2018-10-15 ENCOUNTER — Encounter (HOSPITAL_COMMUNITY)
Admission: RE | Disposition: A | Discharge status: Home / Self Care | Payer: Self-pay | Source: Home / Self Care | Attending: Urology

## 2018-10-15 ENCOUNTER — Other Ambulatory Visit: Payer: Self-pay

## 2018-10-15 DIAGNOSIS — Z9079 Acquired absence of other genital organ(s): Secondary | ICD-10-CM | POA: Diagnosis not present

## 2018-10-15 DIAGNOSIS — Z7982 Long term (current) use of aspirin: Secondary | ICD-10-CM | POA: Insufficient documentation

## 2018-10-15 DIAGNOSIS — Z87442 Personal history of urinary calculi: Secondary | ICD-10-CM | POA: Diagnosis not present

## 2018-10-15 DIAGNOSIS — Z79899 Other long term (current) drug therapy: Secondary | ICD-10-CM | POA: Diagnosis not present

## 2018-10-15 DIAGNOSIS — E785 Hyperlipidemia, unspecified: Secondary | ICD-10-CM | POA: Diagnosis not present

## 2018-10-15 DIAGNOSIS — N393 Stress incontinence (female) (male): Secondary | ICD-10-CM | POA: Insufficient documentation

## 2018-10-15 DIAGNOSIS — I10 Essential (primary) hypertension: Secondary | ICD-10-CM | POA: Insufficient documentation

## 2018-10-15 DIAGNOSIS — C61 Malignant neoplasm of prostate: Secondary | ICD-10-CM | POA: Diagnosis not present

## 2018-10-15 DIAGNOSIS — Z808 Family history of malignant neoplasm of other organs or systems: Secondary | ICD-10-CM | POA: Insufficient documentation

## 2018-10-15 DIAGNOSIS — I341 Nonrheumatic mitral (valve) prolapse: Secondary | ICD-10-CM | POA: Insufficient documentation

## 2018-10-15 DIAGNOSIS — N201 Calculus of ureter: Secondary | ICD-10-CM | POA: Insufficient documentation

## 2018-10-15 HISTORY — DX: Personal history of urinary calculi: Z87.442

## 2018-10-15 HISTORY — PX: EXTRACORPOREAL SHOCK WAVE LITHOTRIPSY: SHX1557

## 2018-10-15 SURGERY — LITHOTRIPSY, ESWL
Anesthesia: LOCAL | Laterality: Left

## 2018-10-15 MED ORDER — CIPROFLOXACIN HCL 500 MG PO TABS
500.0000 mg | ORAL_TABLET | ORAL | Status: AC
  Administered 2018-10-15: 500 mg via ORAL
  Filled 2018-10-15: qty 1

## 2018-10-15 MED ORDER — OXYCODONE-ACETAMINOPHEN 5-325 MG PO TABS: 1.0000 | ORAL_TABLET | Freq: Four times a day (QID) | ORAL | 0 refills | Status: DC | PRN

## 2018-10-15 MED ORDER — SENNOSIDES-DOCUSATE SODIUM 8.6-50 MG PO TABS: 1.0000 | ORAL_TABLET | Freq: Two times a day (BID) | ORAL | 0 refills | Status: DC

## 2018-10-15 MED ORDER — DIPHENHYDRAMINE HCL 25 MG PO CAPS
25.0000 mg | ORAL_CAPSULE | ORAL | Status: AC
  Administered 2018-10-15: 25 mg via ORAL
  Filled 2018-10-15: qty 1

## 2018-10-15 MED ORDER — SODIUM CHLORIDE 0.9 % IV SOLN
INTRAVENOUS | Status: DC
  Administered 2018-10-15: 07:00:00 via INTRAVENOUS

## 2018-10-15 MED ORDER — DIAZEPAM 5 MG PO TABS
10.0000 mg | ORAL_TABLET | ORAL | Status: AC
  Administered 2018-10-15: 07:00:00 10 mg via ORAL
  Filled 2018-10-15: qty 2

## 2018-10-15 NOTE — Discharge Instructions (Signed)
1 - You may have urinary urgency (bladder spasms), pass small stone fragments, and bloody urine on / off for up to 2 weeks. This is normal. ° °2 - Call MD or go to ER for fever >102, severe pain / nausea / vomiting not relieved by medications, or acute change in medical status ° °

## 2018-10-15 NOTE — H&P (Signed)
William Conner. is an 68 y.o. male.    Chief Complaint: Pre-op LEFT Shockwave LIthotripsy  HPI:   1 - Moderate Risk Prostate Cancer - s/p prostatectomy with ICG sentinal + template lymphadenectomy 05/2018 for pT2N0Mx Grade 2 cancer with NEGATIVE margins.   Post-Op Course:  09/2018 - PSA 0.05   2 - Stress Urinary Incontinence - s/p prostatectomy 05/2018. he reciefed peri-op PT. At 3 mos 3-4 pads per day on work days (very active), almost nil when at home, 0 least on CST.   3 - Erectile Dysfunction - s/p prostatectomy 05/2018. NO attempts at stimuliaton post-op yet.   4 - Left Flank Pain / Urolithiaisis -  Pre 2020 - URS x 1, SWL x 1  09/2018 - CT 68mm left UPJ stone, SSD 14cm, 500HU, easily seen at Lt level on scout images (saved in pacs with arrow noting), UA without infectious parameters.   PMH sig for HLD, HTN, Low Thyroid. NO ischemic CV disease / blood thinners. 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 His PCP is William Panda MD.   Today "William Conner" is seen to proceed with LEFT shockwave lithotripsy. NO interval fevers.    Past Medical History:  Diagnosis Date  . History of kidney stones   . Hypertension   . Mitral valve prolapse    age 2  . Prostate cancer [facility])     Past Surgical History:  Procedure Laterality Date  . APPENDECTOMY     at the age of 52  . LITHOTRIPSY    . LYMPHADENECTOMY Bilateral 06/26/2018   Procedure: LYMPHADENECTOMY;  Surgeon: Alexis Frock, MD;  Location: [facility];  Service: Urology;  Laterality: Bilateral;  . OTHER SURGICAL HISTORY     exploratory heart surgery at the age of 36 (22)  . PROSTATE BIOPSY    . ROBOT ASSISTED LAPAROSCOPIC RADICAL PROSTATECTOMY N/A 06/26/2018   Procedure: XI ROBOTIC ASSISTED LAPAROSCOPIC RADICAL PROSTATECTOMY;  Surgeon: Alexis Frock, MD;  Location: [facility];  Service: Urology;  Laterality: N/A;  3 HRS    Family History  Problem Relation Age of Onset  . Cancer Brother        brain tumor   Social History:   reports that he has never smoked. He has never used smokeless tobacco. He reports that he does not drink alcohol or use drugs.  Allergies: No Known Allergies  Medications Prior to Admission  Medication Sig Dispense Refill  . amLODipine-olmesartan (AZOR) 5-40 MG tablet Take 1 tablet by mouth daily.     Marland Kitchen aspirin EC 81 MG tablet Take 81 mg by mouth daily.    Marland Kitchen atorvastatin (LIPITOR) 20 MG tablet Take 20 mg by mouth every evening.    . Cholecalciferol (VITAMIN D3) 25 MCG (1000 UT) CAPS Take by mouth.    . levothyroxine (SYNTHROID, LEVOTHROID) 75 MCG tablet Take 75 mcg by mouth at bedtime.     . Multiple Vitamin (MULTIVITAMIN) tablet Take 1 tablet by mouth daily.    Marland Kitchen oxyCODONE-acetaminophen (PERCOCET/ROXICET) 5-325 MG tablet Take 1-2 tablets by mouth every 4 (four) hours as needed for severe pain.    . tamsulosin (FLOMAX) 0.4 MG CAPS capsule Take 0.4 mg by mouth.    Marland Kitchen HYDROcodone-acetaminophen (NORCO) 5-325 MG tablet Take 1-2 tablets by mouth every 6 (six) hours as needed. 30 tablet 0  . ondansetron (ZOFRAN) 4 MG tablet Take 4 mg by mouth every 8 (eight) hours as needed for nausea or vomiting.    . sulfamethoxazole-trimethoprim (BACTRIM DS,SEPTRA DS)  800-160 MG tablet Take 1 tablet by mouth 2 (two) times daily. Start the day prior to foley removal appointment 6 tablet 0    Results for orders placed or performed during the hospital encounter of 10/13/18 (from the past 48 hour(s))  Novel Coronavirus, NAA (hospital order; send-out to ref lab)     Status: None   Collection Time: 10/13/18  2:06 PM   Specimen: Nasopharyngeal Swab; Respiratory  Result Value Ref Range   SARS-CoV-2, NAA NOT DETECTED NOT DETECTED    Comment: (NOTE) This test was developed and its performance characteristics determined by [employer]. This test has not been FDA cleared or approved. This test has been authorized by FDA under an Emergency Use Authorization (EUA). This test is only authorized for the duration  of time the declaration that circumstances exist justifying the authorization of the emergency use of in vitro diagnostic tests for detection of SARS-CoV-2 virus and/or diagnosis of COVID-19 infection under section 564(b)(1) of the Act, 21 U.S.C. 203TDH-7(C)(1), unless the authorization is terminated or revoked sooner. When diagnostic testing is negative, the possibility of a false negative result should be considered in the context of a patient's recent exposures and the presence of clinical signs and symptoms consistent with COVID-19. An individual without symptoms of COVID-19 and who is not shedding SARS-CoV-2 virus would expect to have a negative (not detected) result in this assay. Performed  At: [facility] [place], [place] [id number] Rush Farmer MD OE:[id number]    Coronavirus Source NASOPHARYNGEAL     Comment: Performed at [facility], [place] [address] [place], [place] [address]   No results found.  Review of Systems  Constitutional: Negative.  Negative for chills and fever.  HENT: Negative.   Genitourinary: Positive for hematuria.  Skin: Negative.   All other systems reviewed and are negative.   Blood pressure 133/81, pulse (!) 56, temperature 98.2 F (36.8 C), temperature source Oral, resp. rate 18, height 5\' 10"  (1.778 m), weight 77.2 kg, SpO2 100 %. Physical Exam  Constitutional: He appears well-developed.  HENT:  Head: Normocephalic.  Eyes: Pupils are equal, round, and reactive to light.  Neck: Normal range of motion.  Cardiovascular: Normal rate.  Respiratory: Effort normal.  GI:  Prior scars w/o hernias.   Genitourinary:    Genitourinary Comments: Mild LEFT CVAT   Musculoskeletal: Normal range of motion.  Neurological: He is alert.  Skin: Skin is warm.  Psychiatric: He has a normal mood and affect.     Assessment/Plan  Proceed as planned with LEFT shockwave lithotripsy. Risks, benefits, alternatives, expected peri-op  course discussed previously and reiterated today.   Alexis Frock, MD 10/15/2018, 7:49 AM

## 2018-10-15 NOTE — Brief Op Note (Signed)
10/15/2018  9:51 AM  PATIENT:  William Conner.  68 y.o. male  PRE-OPERATIVE DIAGNOSIS:  LEFT URETERAL STONE  POST-OPERATIVE DIAGNOSIS:  * No post-op diagnosis entered *  PROCEDURE:  Procedure(s): EXTRACORPOREAL SHOCK WAVE LITHOTRIPSY (ESWL) (Left)  SURGEON:  Surgeon(s) and Role:    * Alexis Frock, MD - Primary  PHYSICIAN ASSISTANT:   ASSISTANTS: none   ANESTHESIA:   MAC  EBL:  minimal   BLOOD ADMINISTERED:none  DRAINS: none   LOCAL MEDICATIONS USED:  NONE  SPECIMEN:  No Specimen  DISPOSITION OF SPECIMEN:  N/A  COUNTS:  YES  TOURNIQUET:  * No tourniquets in log *  DICTATION: .Note written in paper chart  PLAN OF CARE: Discharge to home after PACU  PATIENT DISPOSITION:  Short Stay   Delay start of Pharmacological VTE agent (>24hrs) due to surgical blood loss or risk of bleeding: yes

## 2018-10-16 ENCOUNTER — Encounter (HOSPITAL_COMMUNITY): Payer: Self-pay | Admitting: Urology

## 2018-12-02 ENCOUNTER — Ambulatory Visit (HOSPITAL_COMMUNITY)
Admission: RE | Admit: 2018-12-02 | Discharge: 2018-12-02 | Disposition: A | Discharge status: Home / Self Care | Payer: Medicare HMO | Source: Ambulatory Visit | Attending: Family | Admitting: Family

## 2018-12-02 ENCOUNTER — Telehealth (HOSPITAL_COMMUNITY): Payer: Self-pay | Admitting: Rehabilitation

## 2018-12-02 ENCOUNTER — Other Ambulatory Visit: Payer: Self-pay

## 2018-12-02 ENCOUNTER — Other Ambulatory Visit (HOSPITAL_COMMUNITY): Payer: Self-pay | Admitting: Internal Medicine

## 2018-12-02 DIAGNOSIS — M7989 Other specified soft tissue disorders: Secondary | ICD-10-CM

## 2018-12-02 NOTE — Progress Notes (Signed)
BLE Venous Duplex performed. Preliminary results given to [facility], patient instructed to return home. Preliminary report in Epic.

## 2018-12-02 NOTE — Telephone Encounter (Signed)

## 2019-04-30 IMAGING — CR DG KNEE COMPLETE 4+V*R*
4 series · 4 of 4 positions shown · non-contrast
Comparison: None.

CLINICAL DATA: Right knee pain and swelling for 2 weeks.

EXAM:
RIGHT KNEE - COMPLETE 4+ VIEW

[w knee ap right]
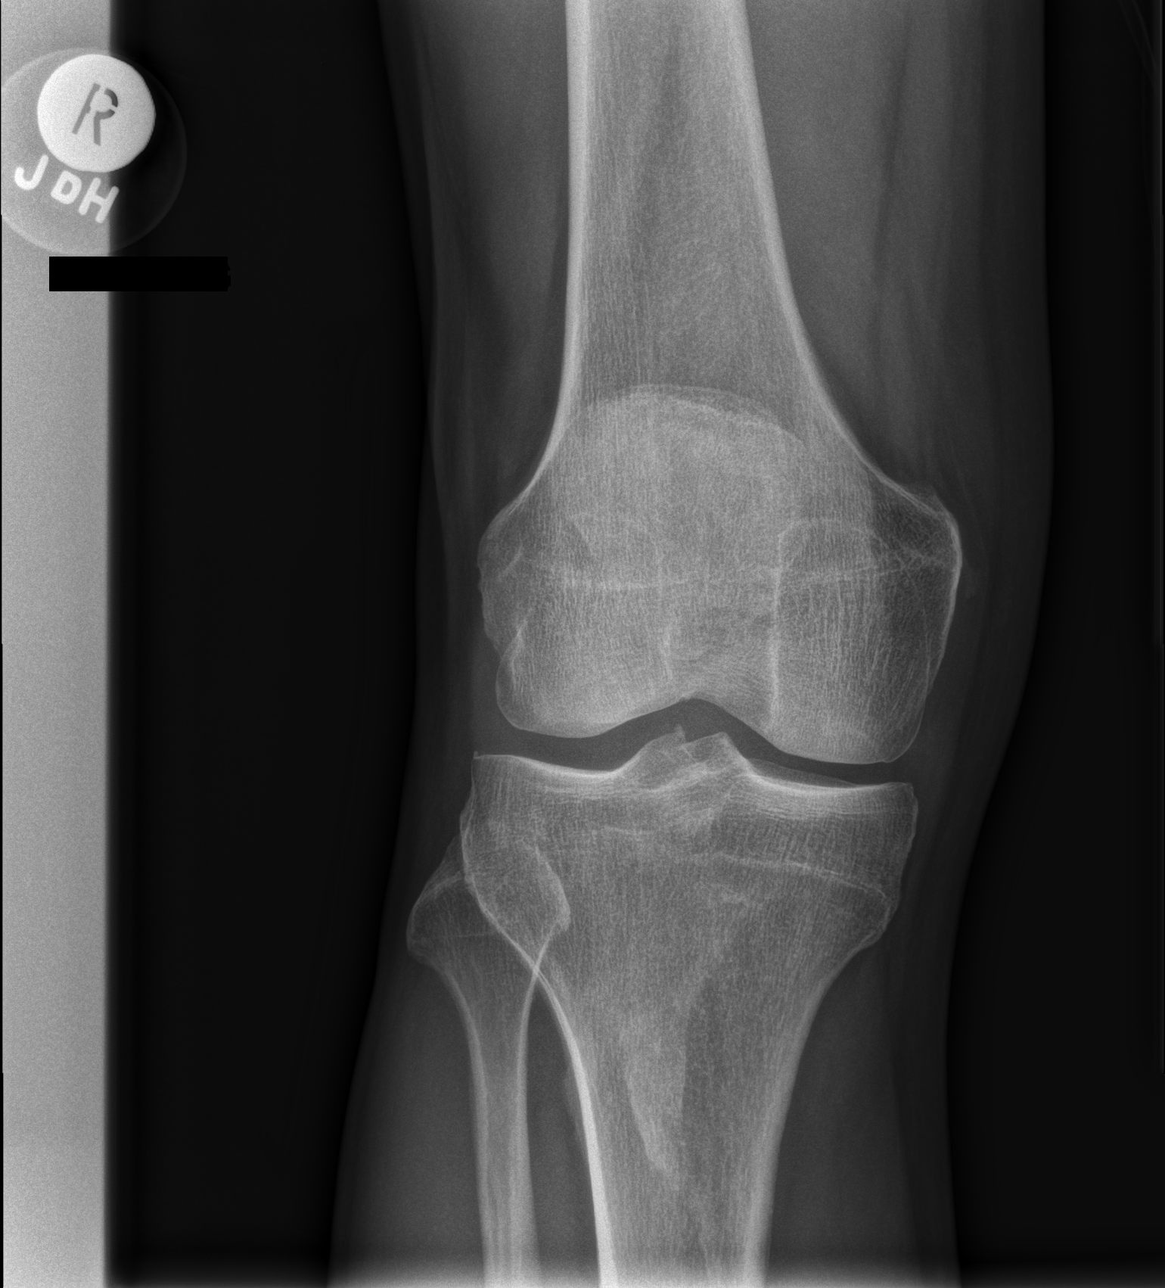

[w knee lat right]
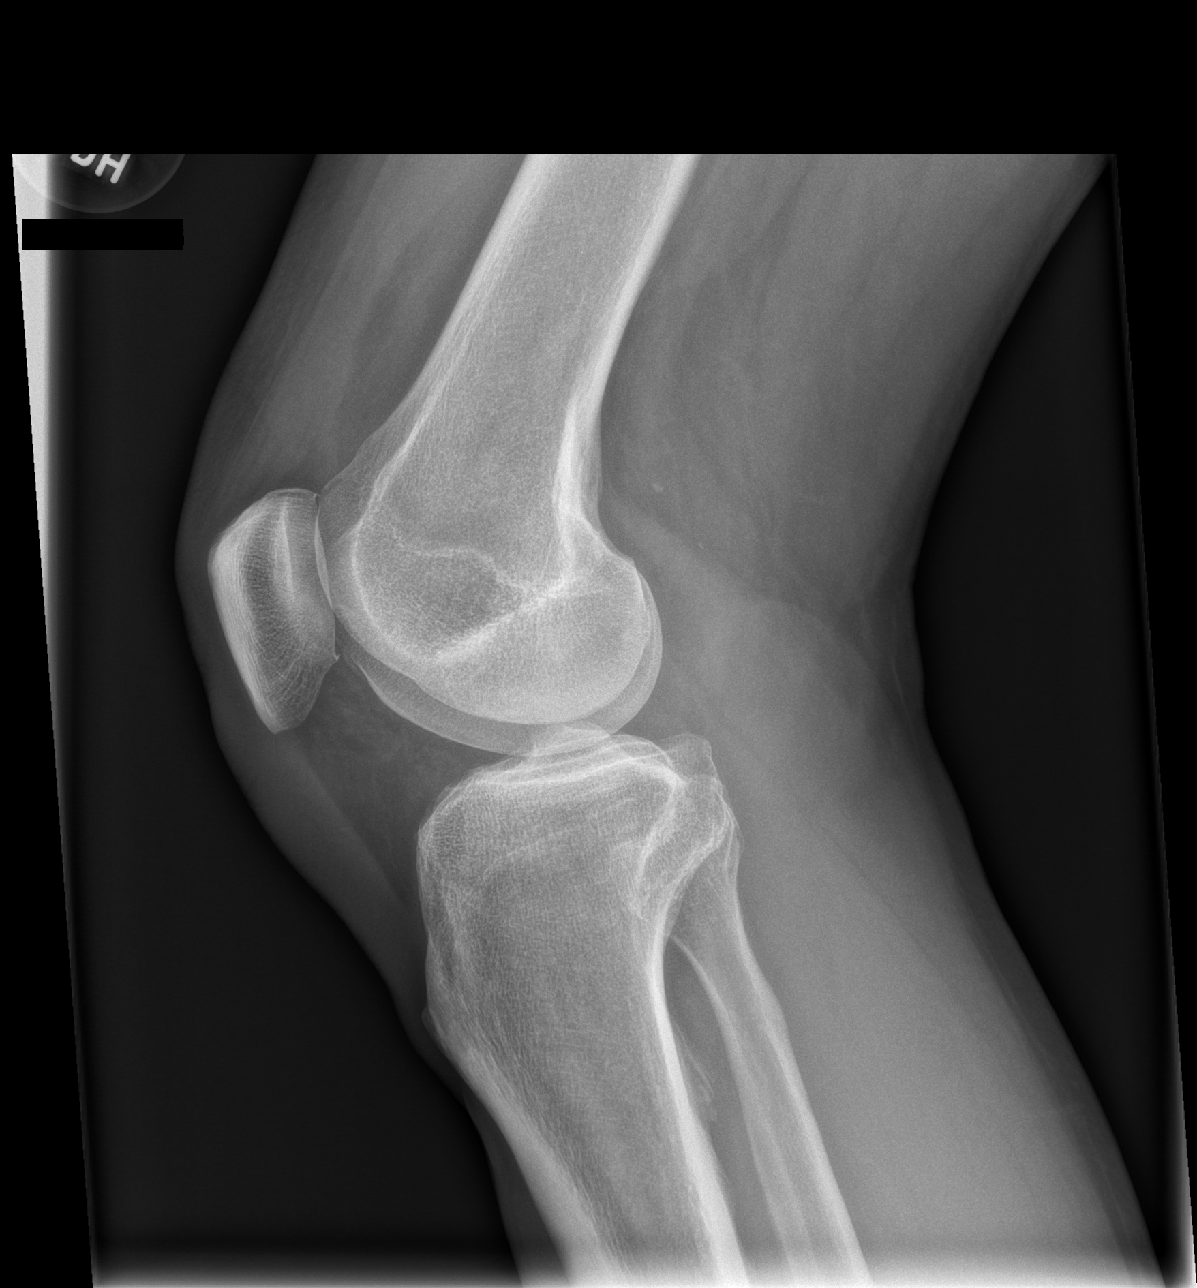

[x knee tunnel right]
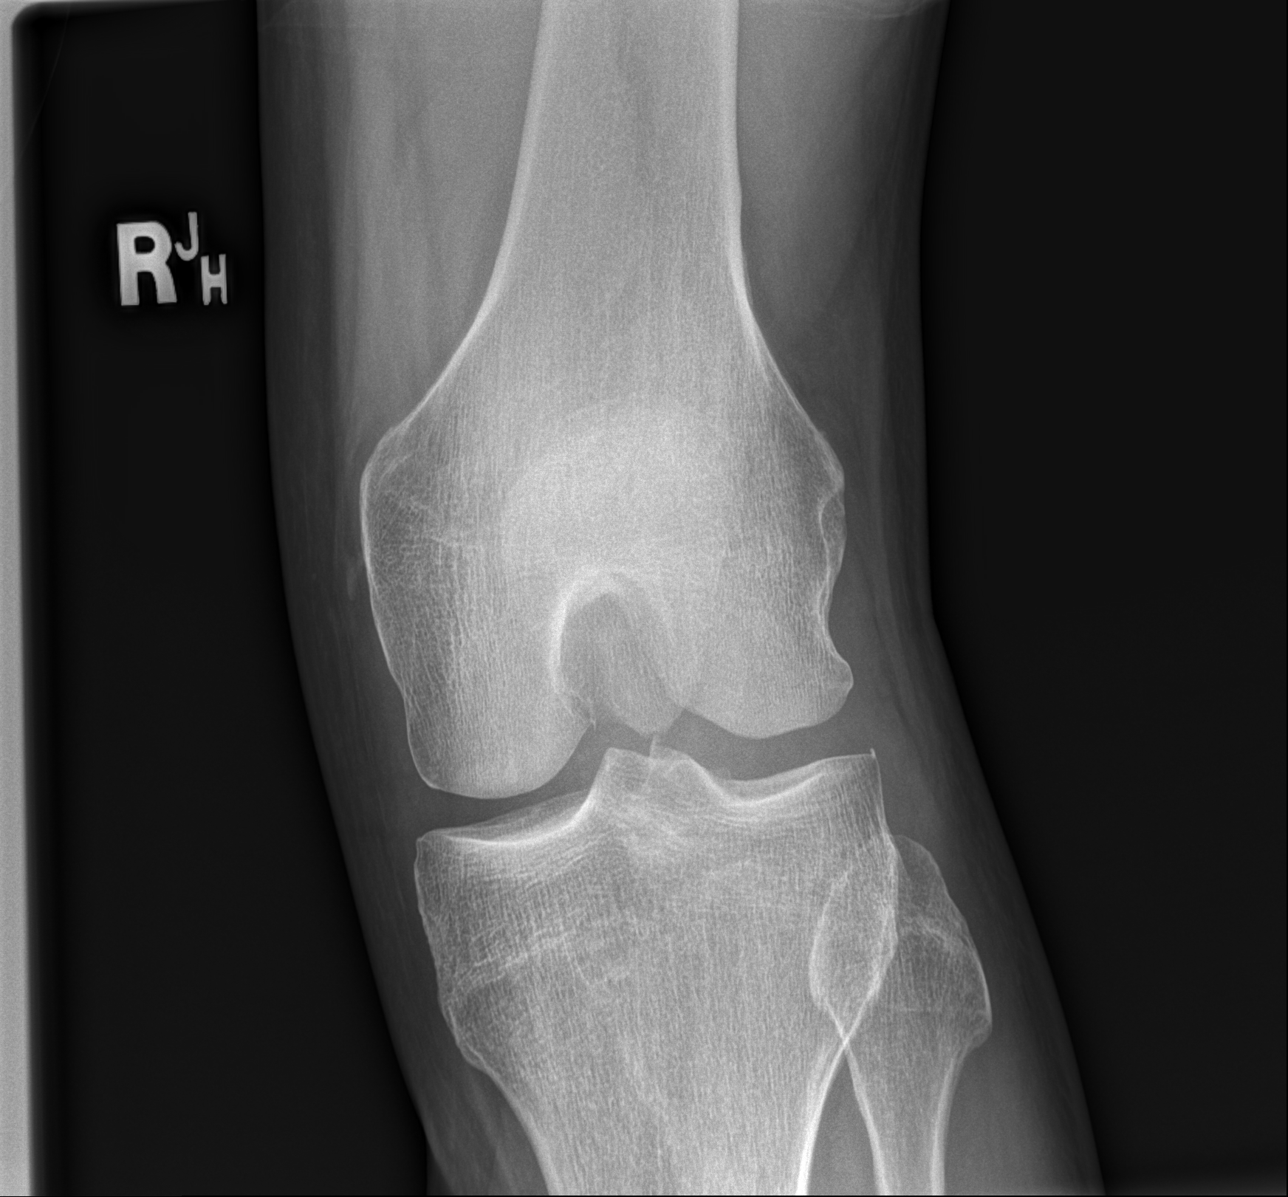

[x knee sunrise right]
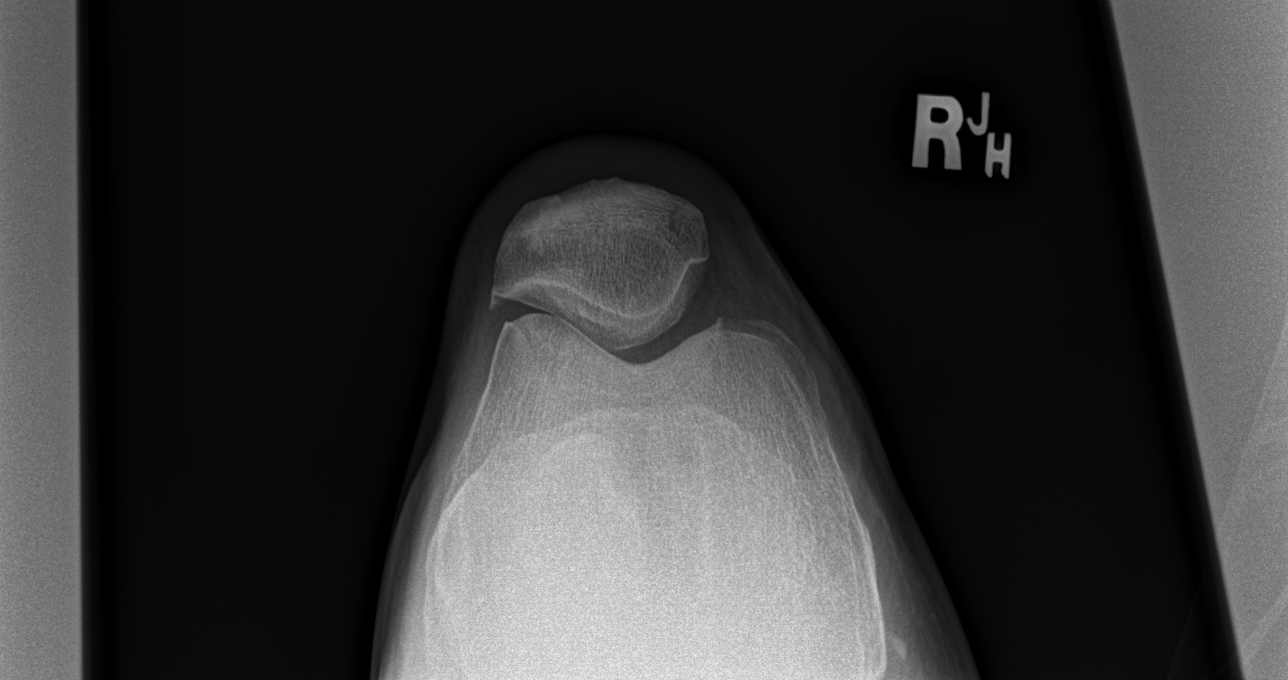

[4 of 4 positions shown; findings below may reference images not displayed]

FINDINGS: No evidence of fracture, dislocation, or joint effusion. Minimal
degenerative spurring is noted without joint space narrowing. No
other osseous abnormality identified.

Rounded soft tissue density is seen posteriorly in the region of the
popliteal fossa, suspicious for a Baker's cyst.
IMPRESSION: 1. No acute findings.
2. Minimal degenerative spurring of knee joint.
3. Probable Baker's cyst. Consider ultrasound for further evaluation
if clinically warranted.

## 2020-02-28 DIAGNOSIS — M109 Gout, unspecified: Secondary | ICD-10-CM | POA: Diagnosis not present

## 2020-02-28 DIAGNOSIS — E785 Hyperlipidemia, unspecified: Secondary | ICD-10-CM | POA: Diagnosis not present

## 2020-02-28 DIAGNOSIS — E039 Hypothyroidism, unspecified: Secondary | ICD-10-CM | POA: Diagnosis not present

## 2020-02-28 DIAGNOSIS — Z125 Encounter for screening for malignant neoplasm of prostate: Secondary | ICD-10-CM | POA: Diagnosis not present

## 2020-03-06 DIAGNOSIS — I10 Essential (primary) hypertension: Secondary | ICD-10-CM | POA: Diagnosis not present

## 2020-03-06 DIAGNOSIS — M109 Gout, unspecified: Secondary | ICD-10-CM | POA: Diagnosis not present

## 2020-03-06 DIAGNOSIS — N5231 Erectile dysfunction following radical prostatectomy: Secondary | ICD-10-CM | POA: Diagnosis not present

## 2020-03-06 DIAGNOSIS — E785 Hyperlipidemia, unspecified: Secondary | ICD-10-CM | POA: Diagnosis not present

## 2020-03-06 DIAGNOSIS — Z0000 Encounter for general adult medical examination without abnormal findings: Secondary | ICD-10-CM | POA: Diagnosis not present

## 2020-03-06 DIAGNOSIS — E039 Hypothyroidism, unspecified: Secondary | ICD-10-CM | POA: Diagnosis not present

## 2020-03-06 DIAGNOSIS — R82998 Other abnormal findings in urine: Secondary | ICD-10-CM | POA: Diagnosis not present

## 2020-03-06 DIAGNOSIS — N393 Stress incontinence (female) (male): Secondary | ICD-10-CM | POA: Diagnosis not present

## 2020-03-06 DIAGNOSIS — C61 Malignant neoplasm of prostate: Secondary | ICD-10-CM | POA: Diagnosis not present

## 2020-03-27 DIAGNOSIS — Z1212 Encounter for screening for malignant neoplasm of rectum: Secondary | ICD-10-CM | POA: Diagnosis not present

## 2020-04-03 DIAGNOSIS — C61 Malignant neoplasm of prostate: Secondary | ICD-10-CM | POA: Diagnosis not present

## 2020-04-10 DIAGNOSIS — N393 Stress incontinence (female) (male): Secondary | ICD-10-CM | POA: Diagnosis not present

## 2020-04-10 DIAGNOSIS — N5231 Erectile dysfunction following radical prostatectomy: Secondary | ICD-10-CM | POA: Diagnosis not present

## 2020-04-10 DIAGNOSIS — N202 Calculus of kidney with calculus of ureter: Secondary | ICD-10-CM | POA: Diagnosis not present

## 2020-04-10 DIAGNOSIS — C61 Malignant neoplasm of prostate: Secondary | ICD-10-CM | POA: Diagnosis not present

## 2023-12-22 ENCOUNTER — Ambulatory Visit (HOSPITAL_COMMUNITY)
Admission: RE | Admit: 2023-12-22 | Discharge: 2023-12-22 | Disposition: A | Discharge status: Home / Self Care | Source: Ambulatory Visit | Attending: Surgery | Admitting: Surgery

## 2023-12-22 ENCOUNTER — Other Ambulatory Visit (HOSPITAL_COMMUNITY): Payer: Self-pay | Admitting: Family Medicine

## 2023-12-22 DIAGNOSIS — M79604 Pain in right leg: Secondary | ICD-10-CM | POA: Diagnosis present

## 2024-05-12 ENCOUNTER — Other Ambulatory Visit: Payer: Self-pay | Admitting: Urology

## 2024-05-18 NOTE — Patient Instructions (Signed)
 SURGICAL WAITING ROOM VISITATION Patients having surgery or a procedure may have no more than 2 support people in the waiting area - these visitors may rotate in the visitor waiting room.   Due to an increase in RSV and influenza rates and associated hospitalizations, children ages 51 and under may not visit patients in [facility] hospitals. If the patient needs to stay at the hospital during part of their recovery, the visitor guidelines for inpatient rooms apply.  PRE-OP VISITATION  Pre-op nurse will coordinate an appropriate time for 1 support person to accompany the patient in pre-op.  This support person may not rotate.  This visitor will be contacted when the time is appropriate for the visitor to come back in the pre-op area.  Please refer to the [facility] website for the visitor guidelines for Inpatients (after your surgery is over and you are in a regular room).  You are not required to quarantine at this time prior to your surgery. However, you must do this: Hand Hygiene often Do NOT share personal items Notify your provider if you are in close contact with someone who has COVID or you develop fever 100.4 or greater, new onset of sneezing, cough, sore throat, shortness of breath or body aches.  If you test positive for Covid or have been in contact with anyone that has tested positive in the last 10 days please notify you surgeon.    Your procedure is scheduled on: 05/20/24   Report to [facility] Main Entrance: [place] entrance where the [employer] is available.   Report to admitting at:  12:10 PM  Call this number if you have any questions or problems the morning of surgery [phone]  FOLLOW ANY ADDITIONAL PRE OP INSTRUCTIONS YOU RECEIVED FROM YOUR SURGEON'S OFFICE!!!  Do not eat food after Midnight the night prior to your surgery/procedure.  After Midnight you may have the following liquids until: 8:00 AM DAY OF SURGERY  Clear Liquid Diet Water  Black  Coffee (sugar ok, NO MILK/CREAM OR CREAMERS)  Tea (sugar ok, NO MILK/CREAM OR CREAMERS) regular and decaf                             Juice: NO CITRUS JUICES: only apple, WHITE grape, WHITE cranberry Sports drinks like Gatorade or Powerade (NO RED)   Oral Hygiene is also important to reduce your risk of infection.        Remember - BRUSH YOUR TEETH THE MORNING OF SURGERY WITH YOUR REGULAR TOOTHPASTE  Do NOT smoke after Midnight the night before surgery.  STOP TAKING all Vitamins, Herbs and supplements 1 week before your surgery.   Take ONLY these medicines the morning of surgery with A SIP OF WATER : amlodipine ,levothyroxine .                  You may not have any metal on your body including hair pins, jewelry, and body piercing  Do not wear lotions, powders, perfumes / cologne, or deodorant   Men may shave face and neck.  Contacts, Hearing Aids, dentures or bridgework may not be worn into surgery. DENTURES WILL BE REMOVED PRIOR TO SURGERY PLEASE DO NOT APPLY Poly grip OR ADHESIVES!!!  You may bring a small overnight bag with you on the day of surgery, only pack items that are not valuable. [facility] IS NOT RESPONSIBLE   FOR VALUABLES THAT ARE LOST OR STOLEN.   Patients discharged on the  day of surgery will not be allowed to drive home.  Someone NEEDS to stay with you for the first 24 hours after anesthesia.  Do not bring your home medications to the hospital. The Pharmacy will dispense medications listed on your medication list to you during your admission in the Hospital.  Special Instructions: Bring a copy of your healthcare power of attorney and living will documents the day of surgery, if you wish to have them scanned into your [place] [facility] Records- EPIC  Please read over the following fact sheets you were given: IF YOU HAVE QUESTIONS ABOUT YOUR PRE-OP INSTRUCTIONS, PLEASE CALL [phone]   [facility] - Preparing for Surgery      Before surgery, you can play an  important role.  Because skin is not sterile, your skin needs to be as free of germs as possible.  You can reduce the number of germs on your skin by washing with CHG (chlorahexidine gluconate) soap before surgery.  CHG is an antiseptic cleaner which kills germs and bonds with the skin to continue killing germs even after washing. Please DO NOT use if you have an allergy to CHG or antibacterial soaps.  If your skin becomes reddened/irritated stop using the CHG and inform your nurse when you arrive at Short Stay. Do not shave (including legs and underarms) for at least 48 hours prior to the first CHG shower.  You may shave your face/neck.  Please follow these instructions carefully:  1.  Shower with CHG Soap the night before surgery ONLY (DO NOT USE THE SOAP THE MORNING OF SURGERY).  2.  If you choose to wash your hair, wash your hair first as usual with your normal  shampoo.  3.  After you shampoo, rinse your hair and body thoroughly to remove the shampoo.                             4.  Use CHG as you would any other liquid soap.  You can apply chg directly to the skin and wash.  Gently with a scrungie or clean washcloth.  5.  Apply the CHG Soap to your body ONLY FROM THE NECK DOWN.   Do not use on face/ open                           Wound or open sores. Avoid contact with eyes, ears mouth and genitals (private parts).                       Wash face,  Genitals (private parts) with your normal soap.             6.  Wash thoroughly, paying special attention to the area where your  surgery  will be performed.  7.  Thoroughly rinse your body with warm water  from the neck down.  8.  DO NOT shower/wash with your normal soap after using and rinsing off the CHG Soap.                9.  Pat yourself dry with a clean towel.            10.  Wear clean pajamas.            11.  Place clean sheets on your bed the night of your first shower and do not  sleep with pets.  Day of Surgery :  Do not apply any CHG,  lotions/deodorants the morning of surgery.  Please wear clean clothes to the hospital/surgery center.   FAILURE TO FOLLOW THESE INSTRUCTIONS MAY RESULT IN THE CANCELLATION OF YOUR SURGERY  PATIENT SIGNATURE_________________________________  NURSE SIGNATURE__________________________________  ________________________________________________________________________

## 2024-05-19 ENCOUNTER — Encounter (HOSPITAL_COMMUNITY)
Admission: RE | Admit: 2024-05-19 | Discharge: 2024-05-19 | Disposition: A | Discharge status: Home / Self Care | Source: Ambulatory Visit | Attending: Urology | Admitting: Urology

## 2024-05-19 ENCOUNTER — Other Ambulatory Visit: Payer: Self-pay

## 2024-05-19 ENCOUNTER — Encounter (HOSPITAL_COMMUNITY): Payer: Self-pay

## 2024-05-19 VITALS — BP 150/89 | HR 59 | Temp 97.8°F | Ht 70.0 in | Wt 165.0 lb

## 2024-05-19 DIAGNOSIS — Z8546 Personal history of malignant neoplasm of prostate: Secondary | ICD-10-CM | POA: Insufficient documentation

## 2024-05-19 DIAGNOSIS — R011 Cardiac murmur, unspecified: Secondary | ICD-10-CM | POA: Diagnosis not present

## 2024-05-19 DIAGNOSIS — I10 Essential (primary) hypertension: Secondary | ICD-10-CM | POA: Diagnosis not present

## 2024-05-19 DIAGNOSIS — N471 Phimosis: Secondary | ICD-10-CM | POA: Diagnosis not present

## 2024-05-19 DIAGNOSIS — Z01810 Encounter for preprocedural cardiovascular examination: Secondary | ICD-10-CM | POA: Diagnosis present

## 2024-05-19 DIAGNOSIS — E039 Hypothyroidism, unspecified: Secondary | ICD-10-CM | POA: Insufficient documentation

## 2024-05-19 DIAGNOSIS — Z01818 Encounter for other preprocedural examination: Secondary | ICD-10-CM | POA: Insufficient documentation

## 2024-05-19 DIAGNOSIS — Z01812 Encounter for preprocedural laboratory examination: Secondary | ICD-10-CM | POA: Diagnosis present

## 2024-05-19 HISTORY — DX: Cardiac murmur, unspecified: R01.1

## 2024-05-19 HISTORY — DX: Hypothyroidism, unspecified: E03.9

## 2024-05-19 LAB — BASIC METABOLIC PANEL WITH GFR
Anion gap: 10 (ref 5–15)
BUN: 11 mg/dL (ref 8–23)
CO2: 27 mmol/L (ref 22–32)
Calcium: 9.8 mg/dL (ref 8.9–10.3)
Chloride: 104 mmol/L (ref 98–111)
Creatinine, Ser: 0.94 mg/dL (ref 0.61–1.24)
GFR, Estimated: >60 mL/min
Glucose, Bld: 108 mg/dL — ABNORMAL HIGH (ref 70–99)
Potassium: 4.1 mmol/L (ref 3.5–5.1)
Sodium: 140 mmol/L (ref 135–145)

## 2024-05-19 LAB — CBC
HCT: 49.9 % (ref 39.0–52.0)
Hemoglobin: 17 g/dL (ref 13.0–17.0)
MCH: 29.8 pg (ref 26.0–34.0)
MCHC: 34.1 g/dL (ref 30.0–36.0)
MCV: 87.5 fL (ref 80.0–100.0)
Platelets: 259 K/uL (ref 150–400)
RBC: 5.7 MIL/uL (ref 4.22–5.81)
RDW: 14.2 % (ref 11.5–15.5)
WBC: 8.7 K/uL (ref 4.0–10.5)
nRBC: 0 % (ref 0.0–0.2)

## 2024-05-19 NOTE — Anesthesia Preprocedure Evaluation (Signed)
 "                                  Anesthesia Evaluation  Patient identified by MRN, date of birth, ID band Patient awake    Reviewed: Allergy & Precautions, NPO status , Patient's Chart, lab work & pertinent test results  History of Anesthesia Complications Negative for: history of anesthetic complications  Airway Mallampati: II  TM Distance: >3 FB Neck ROM: Full    Dental no notable dental hx. (+) Upper Dentures, Lower Dentures, Dental Advisory Given   Pulmonary neg pulmonary ROS   Pulmonary exam normal breath sounds clear to auscultation       Cardiovascular hypertension, (-) angina (-) Past MI Normal cardiovascular exam+ Valvular Problems/Murmurs MVP  Rhythm:Regular Rate:Normal     Neuro/Psych negative neurological ROS  negative psych ROS   GI/Hepatic   Endo/Other  Hypothyroidism    Renal/GU Renal diseaseNephrolithiasis. Lab Results      Component                Value               Date                              K                        4.1                 05/19/2024                    CREATININE               0.94                05/19/2024                         Prostat eca s/p prostatectomy negative genitourinary   Musculoskeletal   Abdominal   Peds  Hematology Lab Results      Component                Value               Date                      WBC                      8.7                 05/19/2024                HGB                      17.0                05/19/2024                HCT                      49.9                05/19/2024                MCV  87.5                05/19/2024                PLT                      259                 05/19/2024              Anesthesia Other Findings   Reproductive/Obstetrics                              Anesthesia Physical Anesthesia Plan  ASA: 2  Anesthesia Plan: General   Post-op Pain Management: Precedex and Tylenol  PO  (pre-op)*   Induction: Intravenous  PONV Risk Score and Plan: 3 and Treatment may vary due to age or medical condition, Dexamethasone  and Ondansetron   Airway Management Planned: LMA  Additional Equipment: None  Intra-op Plan:   Post-operative Plan: Extubation in OR  Informed Consent: I have reviewed the patients History and Physical, chart, labs and discussed the procedure including the risks, benefits and alternatives for the proposed anesthesia with the patient or authorized representative who has indicated his/her understanding and acceptance.     Dental advisory given  Plan Discussed with: CRNA and Surgeon  Anesthesia Plan Comments:          Anesthesia Quick Evaluation  "

## 2024-05-19 NOTE — Progress Notes (Signed)
 For Anesthesia: PCP - Valentin Skates, DO  Cardiologist - N/A  Bowel Prep reminder:  Chest x-ray -  EKG - 05/19/24 Stress Test -  ECHO -  Cardiac Cath -  Pacemaker/ICD device last checked: Pacemaker orders received: Device Rep notified:  Spinal Cord Stimulator:N/A  Sleep Study - N/A CPAP -   Fasting Blood Sugar - N/A Checks Blood Sugar _____ times a day Date and result of last Hgb A1c-  Last dose of GLP1 agonist- N/A GLP1 instructions: Hold 7 days prior to schedule (Hold 24 hours-daily)   Last dose of SGLT-2 inhibitors- N/A SGLT-2 instructions: Hold 72 hours prior to surgery  Blood Thinner Instructions:N/A Last Dose: Time last taken:  Aspirin Instructions:N/A Last Dose: Time last taken:  Activity level: Can go up a flight of stairs and activities of daily living without stopping and without chest pain and/or shortness of breath   Able to exercise without chest pain and/or shortness of breath  Anesthesia review: Hx: HTN,Mitral valve prolapse,Murmur  Patient denies shortness of breath, fever, cough and chest pain at PAT appointment   Patient verbalized understanding of instructions that were reviewed over the telephone.

## 2024-05-19 NOTE — Progress Notes (Signed)
"   Case: [id number] Date/Time: 05/20/24 1410   Procedure: CIRCUMCISION, ADULT   Anesthesia type: General   Diagnosis: Phimosis [N47.1]   Pre-op diagnosis: phimosis   Location: [facility] ROOM 03 / [facility]   Surgeons: Alvaro Ricardo KATHEE Mickey., MD       DISCUSSION: William Conner is a 74 yo male with PMH of HTN, heart murmur, hypothyroid, prostate cancer s/p prostatectomy (2020).  Patient seen in PAT due to hx of heart murmur as a child. Pt states that he was sent to [facility] for a cardiac cath at age 47 due to this hx and apparently his mother was told that he would live past age 27. He was seen again as an adult by a cardiologist around age 2 and was told that he did not have a significant heart murmur and has no limitations. Has not had any follow up for medical care until the age of 49. He had a prostatectomy in 2020 which was uncomplicated. On exam there is no significant heart murmur. 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 Anticipate he can proceed. He does have vasovagal syncope with seeing blood.  Last seen by PCP on 03/19/24. BP meds were decreased due to orthostasis. No other acute issues.  EKG sinus bradycardia, rate 57  VS: BP (!) 150/89   Pulse (!) 59   Temp 36.6 C (Oral)   Ht 5' 10 (1.778 m)   Wt 74.8 kg   SpO2 100%   BMI 23.68 kg/m   PROVIDERS: Valentin Skates, DO   LABS: Labs reviewed: Acceptable for surgery. (all labs ordered are listed, but only abnormal results are displayed)  Labs Reviewed  BASIC METABOLIC PANEL WITH GFR - Abnormal; Notable for the following components:      Result Value   Glucose, Bld 108 (*)    All other components within normal limits  CBC    Past Medical History:  Diagnosis Date   Heart murmur    childhood   History of kidney stones    Hypertension    Hypothyroidism    Prostate cancer [facility])     Past Surgical History:  Procedure Laterality Date   APPENDECTOMY     at the age of 42   EXTRACORPOREAL SHOCK  WAVE LITHOTRIPSY Left 10/15/2018   Procedure: EXTRACORPOREAL SHOCK WAVE LITHOTRIPSY (ESWL);  Surgeon: Alvaro Ricardo, MD;  Location: [facility];  Service: Urology;  Laterality: Left;   LITHOTRIPSY     LYMPHADENECTOMY Bilateral 06/26/2018   Procedure: LYMPHADENECTOMY;  Surgeon: Alvaro Ricardo, MD;  Location[facility];  Service: Urology;  Laterality: Bilateral;   OTHER SURGICAL HISTORY     exploratory heart surgery at the age of 89 (47)   PROSTATE BIOPSY     ROBOT ASSISTED LAPAROSCOPIC RADICAL PROSTATECTOMY N/A 06/26/2018   Procedure: XI ROBOTIC ASSISTED LAPAROSCOPIC RADICAL PROSTATECTOMY;  Surgeon: Alvaro Ricardo, MD;  Location[facility];  Service: Urology;  Laterality: N/A;  3 HRS    MEDICATIONS:  amLODipine  (NORVASC ) 2.5 MG tablet   atorvastatin  (LIPITOR) 20 MG tablet   Cholecalciferol (VITAMIN D3) 25 MCG (1000 UT) CAPS   levothyroxine  (SYNTHROID , LEVOTHROID) 75 MCG tablet   olmesartan  (BENICAR ) 40 MG tablet   No current facility-administered medications for this encounter.   Burnard CHRISTELLA Odis DEVONNA [facility] Short Stay/Anesthesiology [facility] Phone [phone] 05/19/2024 12:28 PM         "

## 2024-05-20 ENCOUNTER — Ambulatory Visit (HOSPITAL_COMMUNITY)
Admission: RE | Admit: 2024-05-20 | Discharge: 2024-05-20 | Disposition: A | Discharge status: Home / Self Care | Attending: Urology | Admitting: Urology

## 2024-05-20 ENCOUNTER — Encounter (HOSPITAL_COMMUNITY): Payer: Self-pay | Admitting: Urology

## 2024-05-20 ENCOUNTER — Other Ambulatory Visit: Payer: Self-pay

## 2024-05-20 ENCOUNTER — Encounter (HOSPITAL_COMMUNITY): Admitting: Medical

## 2024-05-20 ENCOUNTER — Ambulatory Visit (HOSPITAL_BASED_OUTPATIENT_CLINIC_OR_DEPARTMENT_OTHER): Admitting: Anesthesiology

## 2024-05-20 DIAGNOSIS — N471 Phimosis: Secondary | ICD-10-CM | POA: Insufficient documentation

## 2024-05-20 DIAGNOSIS — Z9079 Acquired absence of other genital organ(s): Secondary | ICD-10-CM | POA: Diagnosis not present

## 2024-05-20 DIAGNOSIS — N393 Stress incontinence (female) (male): Secondary | ICD-10-CM | POA: Diagnosis not present

## 2024-05-20 DIAGNOSIS — E039 Hypothyroidism, unspecified: Secondary | ICD-10-CM | POA: Diagnosis not present

## 2024-05-20 DIAGNOSIS — Z8546 Personal history of malignant neoplasm of prostate: Secondary | ICD-10-CM | POA: Insufficient documentation

## 2024-05-20 DIAGNOSIS — I10 Essential (primary) hypertension: Secondary | ICD-10-CM | POA: Diagnosis not present

## 2024-05-20 MED ORDER — DROPERIDOL 2.5 MG/ML IJ SOLN: 0.6250 mg | Freq: Once | INTRAMUSCULAR | Status: DC | PRN

## 2024-05-20 MED ORDER — DEXAMETHASONE SOD PHOSPHATE PF 10 MG/ML IJ SOLN
INTRAMUSCULAR | Status: AC
  Filled 2024-05-20: qty 1

## 2024-05-20 MED ORDER — ONDANSETRON HCL 4 MG/2ML IJ SOLN
INTRAMUSCULAR | Status: AC
  Filled 2024-05-20: qty 2

## 2024-05-20 MED ORDER — CLINDAMYCIN PHOSPHATE 600 MG/50ML IV SOLN
600.0000 mg | INTRAVENOUS | Status: AC
  Administered 2024-05-20: 600 mg via INTRAVENOUS
  Filled 2024-05-20: qty 50

## 2024-05-20 MED ORDER — BUPIVACAINE HCL 0.25 % IJ SOLN
INTRAMUSCULAR | Status: DC | PRN
  Administered 2024-05-20: 20 mL

## 2024-05-20 MED ORDER — STERILE WATER FOR IRRIGATION IR SOLN
Status: DC | PRN
  Administered 2024-05-20: 1000 mL

## 2024-05-20 MED ORDER — ACETAMINOPHEN 500 MG PO TABS
1000.0000 mg | ORAL_TABLET | Freq: Once | ORAL | Status: AC
  Administered 2024-05-20: 1000 mg via ORAL
  Filled 2024-05-20: qty 2

## 2024-05-20 MED ORDER — SENNOSIDES-DOCUSATE SODIUM 8.6-50 MG PO TABS: 1.0000 | ORAL_TABLET | Freq: Two times a day (BID) | ORAL | 0 refills | Status: AC

## 2024-05-20 MED ORDER — ONDANSETRON HCL 4 MG/2ML IJ SOLN: 4.0000 mg | Freq: Once | INTRAMUSCULAR | Status: DC | PRN

## 2024-05-20 MED ORDER — PHENYLEPHRINE 80 MCG/ML (10ML) SYRINGE FOR IV PUSH (FOR BLOOD PRESSURE SUPPORT)
PREFILLED_SYRINGE | INTRAVENOUS | Status: AC
  Filled 2024-05-20: qty 10

## 2024-05-20 MED ORDER — OXYCODONE-ACETAMINOPHEN 5-325 MG PO TABS: 1.0000 | ORAL_TABLET | Freq: Four times a day (QID) | ORAL | 0 refills | Status: AC | PRN

## 2024-05-20 MED ORDER — LACTATED RINGERS IV SOLN: INTRAVENOUS | Status: DC

## 2024-05-20 MED ORDER — LACTATED RINGERS IV SOLN: INTRAVENOUS | Status: DC | PRN

## 2024-05-20 MED ORDER — FENTANYL CITRATE (PF) 100 MCG/2ML IJ SOLN
INTRAMUSCULAR | Status: AC
  Filled 2024-05-20: qty 2

## 2024-05-20 MED ORDER — DEXAMETHASONE SOD PHOSPHATE PF 10 MG/ML IJ SOLN
INTRAMUSCULAR | Status: DC | PRN
  Administered 2024-05-20: 5 mg via INTRAVENOUS

## 2024-05-20 MED ORDER — OXYCODONE HCL 5 MG PO TABS: 5.0000 mg | ORAL_TABLET | Freq: Once | ORAL | Status: DC | PRN

## 2024-05-20 MED ORDER — PROPOFOL 10 MG/ML IV BOLUS
INTRAVENOUS | Status: DC | PRN
  Administered 2024-05-20: 150 mg via INTRAVENOUS

## 2024-05-20 MED ORDER — OXYCODONE HCL 5 MG/5ML PO SOLN: 5.0000 mg | Freq: Once | ORAL | Status: DC | PRN

## 2024-05-20 MED ORDER — HYDROMORPHONE HCL 1 MG/ML IJ SOLN: 0.2500 mg | INTRAMUSCULAR | Status: DC | PRN

## 2024-05-20 MED ORDER — LIDOCAINE HCL (PF) 2 % IJ SOLN
INTRAMUSCULAR | Status: AC
  Filled 2024-05-20: qty 5

## 2024-05-20 MED ORDER — FENTANYL CITRATE (PF) 100 MCG/2ML IJ SOLN
INTRAMUSCULAR | Status: DC | PRN
  Administered 2024-05-20: 25 ug via INTRAVENOUS

## 2024-05-20 MED ORDER — ORAL CARE MOUTH RINSE: 15.0000 mL | Freq: Once | OROMUCOSAL | Status: AC

## 2024-05-20 MED ORDER — CHLORHEXIDINE GLUCONATE 0.12 % MT SOLN
15.0000 mL | Freq: Once | OROMUCOSAL | Status: AC
  Administered 2024-05-20: 15 mL via OROMUCOSAL

## 2024-05-20 MED ORDER — PROPOFOL 10 MG/ML IV BOLUS
INTRAVENOUS | Status: AC
  Filled 2024-05-20: qty 20

## 2024-05-20 MED ORDER — ONDANSETRON HCL 4 MG/2ML IJ SOLN
INTRAMUSCULAR | Status: DC | PRN
  Administered 2024-05-20: 4 mg via INTRAVENOUS

## 2024-05-20 MED ORDER — ACETAMINOPHEN 10 MG/ML IV SOLN: 1000.0000 mg | Freq: Once | INTRAVENOUS | Status: DC | PRN

## 2024-05-20 MED ORDER — LIDOCAINE 2% (20 MG/ML) 5 ML SYRINGE
INTRAMUSCULAR | Status: DC | PRN
  Administered 2024-05-20: 80 mg via INTRAVENOUS

## 2024-05-20 NOTE — Discharge Instructions (Signed)
 1 - Dressing can come off tomorrow morning at home. OK to shower / bathe after dressing off.  2 - All stitches are dissolvable and will dissapear in about 3 weeks. No active sexual stimulation x 2 weeks. Otherwise no restrictions.   3- Call MD or go to ER for fever >102, severe pain / nausea / vomiting not relieved by medications, or acute change in medical status

## 2024-05-20 NOTE — Op Note (Signed)
 NAME: William Conner, William Conner MEDICAL RECORD NO: [id number] ACCOUNT NO: [id number] DATE OF BIRTH: [birth year] FACILITY: [facility] LOCATION: [facility] PHYSICIAN: Ricardo Likens, MD  Operative Report   DATE OF PROCEDURE: 05/20/2024  SURGEON: Ricardo Likens, MD.   PREOPERATIVE DIAGNOSIS:  Phimosis.  PROCEDURE PERFORMED:  Circumcision.  ESTIMATED BLOOD LOSS:  Nil.  COMPLICATIONS:  None.  SPECIMENS:  Foreskin for discard.  FINDINGS:  Mild phimosis pre-circumcision.  Complete resolution of phimosis post-circumcision.  INDICATIONS:  The patient is a very pleasant 74 year old male with a history of some stress urinary incontinence as well as occasional phimosis with occasional balanitis.  He is uncircumcised.  This has become progressively bothersome and he has not  achieved his goals of care with topical therapy.  Options discussed including maintaining observation or topicals versus surgery with circumcision and he wished to proceed with the latter. Informed consent was obtained and placed in the medical record.   DESCRIPTION OF PROCEDURE: The patient being verified, procedure being circumcision was confirmed.  Procedure timeout was performed. Intravenous antibiotics administered.  General LMA anesthesia induced.  The patient was placed in a supine position and  sterile field was created, prepping and draping the patient's penis, perineum, proximal thighs and inner and outer foreskin leaflet using iodine.  Quadrant sterile towels were placed.  Foreskin was reduced.  The 12 o'clock was marked and the proximal  collar was marked corresponding to the stretched corona of the glans.  The distal collar was developed circumferentially approximately 5 mm proximal to the corona of the glans and the proximal collar was developed at the previously marked site.  Metz  scissors were used to connect this in the midline and 4 hemostats were placed at the 4 dorsal corners respectively.  The redundant preputial collar was  released from the underlying tissue using careful cautery dissection.  Additional hemostasis was  achieved using point coagulation current cautery.  Hemostasis was then quite good.  The 12 o'clock was reapproximated using an interrupted Vicryl.  The frenulum was trimmed for cosmesis and a U-stitch was applied here and then 2 separate running suture  lines were made from the 6 o'clock to the 12 o'clock position respectively on each side reapproximating the proximal and distal collars.  Additionally U-stitches were applied x2 at the area of the frenulum.  A dressing of Xeroform followed by loosely  wrapped Kling and Coban was placed.  Procedure was terminated.  The patient tolerated the procedure well, no immediate periprocedural complications.  The patient was taken to postanesthesia care unit in stable condition.  Plan is for discharge home.   CHR D: 05/20/2024 2:14:37 pm T: 05/20/2024 2:26:00 pm  JOB: [id number]/ [id number]

## 2024-05-20 NOTE — Anesthesia Postprocedure Evaluation (Signed)
"   Anesthesia Post Note  Patient: William Conner.  Procedure(s) Performed: CIRCUMCISION, ADULT (Penis)     Patient location during evaluation: PACU Anesthesia Type: General Level of consciousness: awake and alert Pain management: pain level controlled Vital Signs Assessment: post-procedure vital signs reviewed and stable Respiratory status: spontaneous breathing, nonlabored ventilation, respiratory function stable and patient connected to nasal cannula oxygen Cardiovascular status: blood pressure returned to baseline and stable Postop Assessment: no apparent nausea or vomiting Anesthetic complications: no   No notable events documented.  Last Vitals:  Vitals:   05/20/24 1500 05/20/24 1505  BP: (!) 140/91 (!) 154/87  Pulse: 63 (!) 59  Resp: 15 15  Temp: 36.4 C (!) 36.3 C  SpO2: 100% 97%    Last Pain:  Vitals:   05/20/24 1505  TempSrc:   PainSc: 0-No pain                 Garnette LABOR [name]      "

## 2024-05-20 NOTE — Anesthesia Procedure Notes (Signed)
 Procedure Name: Intubation Date/Time: 05/20/2024 1:38 PM  Performed by: Colene Mines, CRNAPre-anesthesia Checklist: Patient identified, Emergency Drugs available, Suction available and Patient being monitored Patient Re-evaluated:Patient Re-evaluated prior to induction Oxygen Delivery Method: Circle system utilized Preoxygenation: Pre-oxygenation with 100% oxygen Induction Type: IV induction Ventilation: Mask ventilation without difficulty LMA: LMA inserted LMA Size: 4.0 Tube type: Oral Number of attempts: 1 Airway Equipment and Method: Stylet and Oral airway Placement Confirmation: positive ETCO2 and breath sounds checked- equal and bilateral Tube secured with: Tape Dental Injury: Teeth and Oropharynx as per pre-operative assessment

## 2024-05-20 NOTE — Transfer of Care (Signed)
 Immediate Anesthesia Transfer of Care Note  Patient: William Conner.  Procedure(s) Performed: CIRCUMCISION, ADULT (Penis)  Patient Location: PACU  Anesthesia Type:General  Level of Consciousness: awake and alert   Airway & Oxygen Therapy: Patient Spontanous Breathing and Patient connected to nasal cannula oxygen  Post-op Assessment: Report given to RN and Post -op Vital signs reviewed and stable  Post vital signs: Reviewed and stable  Last Vitals:  Vitals Value Taken Time  BP    Temp    Pulse 64 05/20/24 14:26  Resp 10 05/20/24 14:26  SpO2 100 % 05/20/24 14:26  Vitals shown include unfiled device data.  Last Pain:  Vitals:   05/20/24 1115  TempSrc: Oral  PainSc: 0-No pain         Complications: No notable events documented.

## 2024-05-20 NOTE — Brief Op Note (Signed)
 05/20/2024  1:45 PM  2:10 PM  PATIENT:  William Conner.  74 y.o. male  PRE-OPERATIVE DIAGNOSIS:  phimosis  POST-OPERATIVE DIAGNOSIS:  phimosis  PROCEDURE:  Procedures: CIRCUMCISION, ADULT (N/A)  SURGEON:  Surgeons and Role:    * Manny, William Conner., MD - Primary  PHYSICIAN ASSISTANT:   ASSISTANTS: none   ANESTHESIA:   local and general  EBL:  minimal   BLOOD ADMINISTERED:none  DRAINS: none   LOCAL MEDICATIONS USED:  NONE  SPECIMEN:  Source of Specimen:  foreskin  DISPOSITION OF SPECIMEN:  discard  COUNTS:  YES  TOURNIQUET:  * No tourniquets in log *  DICTATION: .Other Dictation: Dictation Number [id number]  PLAN OF CARE: Discharge to home after PACU  PATIENT DISPOSITION:  PACU - hemodynamically stable.   Delay start of Pharmacological VTE agent (>24hrs) due to surgical blood loss or risk of bleeding: yes

## 2024-05-20 NOTE — H&P (Signed)
 William Conner. is an 74 y.o. male.    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 he received peri-op PT. At 3 mos 3-4 pads per day on work days (very active), almost nil when at home, 0 leak on CST. Starting BID duloxitine trial 03/2022 but did not tolerate (heart racing).  3 -Phimosis - increaseing bother form inner foreskin leaflet irritation mostly from just mild shconric mositurs. Some phimotic reing, but retractile. Wans circumcision.   PMH sig for HLD, HTN, Low Thyroid. NO ischemic CV disease / blood thinners. 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 His PCP is Massie Sewer DO  Today William Conner is seen to proceed with circumcision. NO interval fevers. Hgb 17, Cr <1.   Past Medical History:  Diagnosis Date   Heart murmur    childhood   History of kidney stones    Hypertension    Hypothyroidism    Prostate cancer [facility])     Past Surgical History:  Procedure Laterality Date   APPENDECTOMY     at the age of 49   EXTRACORPOREAL SHOCK WAVE LITHOTRIPSY Left 10/15/2018   Procedure: EXTRACORPOREAL SHOCK WAVE LITHOTRIPSY (ESWL);  Surgeon: Alvaro Hummer, MD;  Location: [facility];  Service: Urology;  Laterality: Left;   LITHOTRIPSY     LYMPHADENECTOMY Bilateral 06/26/2018   Procedure: LYMPHADENECTOMY;  Surgeon: Alvaro Hummer, MD;  Location: [facility];  Service: Urology;  Laterality: Bilateral;   OTHER SURGICAL HISTORY     exploratory heart surgery at the age of 77 (4)   PROSTATE BIOPSY     ROBOT ASSISTED LAPAROSCOPIC RADICAL PROSTATECTOMY N/A 06/26/2018   Procedure: XI ROBOTIC ASSISTED LAPAROSCOPIC RADICAL PROSTATECTOMY;  Surgeon:  Alvaro Hummer, MD;  Location: [facility];  Service: Urology;  Laterality: N/A;  3 HRS    Family History  Problem Relation Age of Onset   Cancer Brother        brain tumor   Social History:  reports that he has never smoked. He has never used smokeless tobacco. He reports that he does not drink alcohol and does not use drugs.  Allergies: Allergies[1]  No medications prior to admission.    Results for orders placed or performed during the hospital encounter of 05/19/24 (from the past 48 hours)  Basic metabolic panel per protocol     Status: Abnormal   Collection Time: 05/19/24 10:55 AM  Result Value Ref Range   Sodium 140 135 - 145 mmol/L   Potassium 4.1 3.5 - 5.1 mmol/L   Chloride 104 98 - 111 mmol/L   CO2 27 22 - 32 mmol/L   Glucose, Bld 108 (H) 70 - 99 mg/dL    Comment: Glucose reference range applies only to samples taken after fasting for at least 8 hours.   BUN 11 8 - 23 mg/dL   Creatinine, Ser 9.05 0.61 - 1.24 mg/dL   Calcium  9.8 8.9 - 10.3 mg/dL   GFR, Estimated >39 >39 mL/min    Comment: (NOTE) Calculated using the CKD-EPI Creatinine Equation (2021)    Anion gap 10 5 - 15    Comment: Performed at [facility], [address]  [place], [place] [address]  CBC per protocol     Status: None   Collection Time: 05/19/24 10:55 AM  Result Value Ref Range   WBC 8.7 4.0 - 10.5 K/uL   RBC 5.70 4.22 - 5.81 MIL/uL   Hemoglobin 17.0 13.0 - 17.0 g/dL   HCT 50.0 60.9 - 47.9 %   MCV 87.5 80.0 - 100.0 fL   MCH 29.8 26.0 - 34.0 pg   MCHC 34.1 30.0 - 36.0 g/dL   RDW 85.7 88.4 - 84.4 %   Platelets 259 150 - 400 K/uL   nRBC 0.0 0.0 - 0.2 %    Comment: Performed at [facility], [address], [place], [place] [address]   No results found.  Review of Systems  Constitutional:  Negative for chills and fever.  Genitourinary:  Positive for penile pain.  All other systems reviewed and are negative.   There were no vitals taken for this  visit. Physical Exam Vitals reviewed.  HENT:     Head: Normocephalic.     Mouth/Throat:     Mouth: Mucous membranes are moist.  Eyes:     Pupils: Pupils are equal, round, and reactive to light.  Cardiovascular:     Rate and Rhythm: Normal rate.  Abdominal:     General: Abdomen is flat.     Comments: Prior scars w/o hernias.   Genitourinary:    Comments: Moderate phimosis w/o active balanitis Musculoskeletal:        General: Normal range of motion.     Cervical back: Normal range of motion.  Skin:    General: Skin is warm.  Neurological:     General: No focal deficit present.     Mental Status: He is alert.      Assessment/Plan  Proceed as planned with circumcision. Risks, benefits, alternatives, expected peri-op course discussed previously and reiterated today.   Ricardo KATHEE Alvaro Mickey., MD 05/20/2024, 7:28 AM       [1]  Allergies Allergen Reactions   Other Rash    Black Walnuts

## 2024-05-21 ENCOUNTER — Encounter (HOSPITAL_COMMUNITY): Payer: Self-pay | Admitting: Urology
